# Patient Record
Sex: Male | Born: 1945
Health system: Southern US, Community
[De-identification: ages and names within clinical notes are randomized; demographics above are authoritative.]

## PROBLEM LIST (undated history)

## (undated) DIAGNOSIS — E785 Hyperlipidemia, unspecified: Secondary | ICD-10-CM

## (undated) DIAGNOSIS — R7611 Nonspecific reaction to tuberculin skin test without active tuberculosis: Secondary | ICD-10-CM

## (undated) DIAGNOSIS — I639 Cerebral infarction, unspecified: Secondary | ICD-10-CM

## (undated) DIAGNOSIS — I2510 Atherosclerotic heart disease of native coronary artery without angina pectoris: Secondary | ICD-10-CM

## (undated) DIAGNOSIS — I219 Acute myocardial infarction, unspecified: Secondary | ICD-10-CM

## (undated) HISTORY — DX: Acute myocardial infarction, unspecified: I21.9

## (undated) HISTORY — DX: Nonspecific reaction to tuberculin skin test without active tuberculosis: R76.11

## (undated) HISTORY — DX: Atherosclerotic heart disease of native coronary artery without angina pectoris: I25.10

## (undated) HISTORY — DX: Cerebral infarction, unspecified: I63.9

## (undated) HISTORY — DX: Hyperlipidemia, unspecified: E78.5

## (undated) HISTORY — PX: APPENDECTOMY: SHX54

---

## 1983-02-22 HISTORY — PX: INGUINAL HERNIA REPAIR: SHX194

## 1989-02-21 DIAGNOSIS — I219 Acute myocardial infarction, unspecified: Secondary | ICD-10-CM

## 1989-02-21 HISTORY — DX: Acute myocardial infarction, unspecified: I21.9

## 1989-02-21 HISTORY — PX: CORONARY ARTERY BYPASS GRAFT: SHX141

## 1999-03-18 ENCOUNTER — Encounter: Payer: Self-pay | Admitting: Internal Medicine

## 1999-03-18 ENCOUNTER — Encounter: Admission: RE | Admit: 1999-03-18 | Discharge: 1999-03-18 | Payer: Self-pay | Admitting: Internal Medicine

## 1999-04-12 ENCOUNTER — Encounter (INDEPENDENT_AMBULATORY_CARE_PROVIDER_SITE_OTHER): Payer: Self-pay | Admitting: Specialist

## 1999-04-12 ENCOUNTER — Ambulatory Visit (HOSPITAL_BASED_OUTPATIENT_CLINIC_OR_DEPARTMENT_OTHER): Admission: RE | Admit: 1999-04-12 | Discharge: 1999-04-12 | Payer: Self-pay | Admitting: Urology

## 1999-04-20 ENCOUNTER — Encounter: Admission: RE | Admit: 1999-04-20 | Discharge: 1999-04-20 | Payer: Self-pay | Admitting: Urology

## 1999-04-20 ENCOUNTER — Encounter: Payer: Self-pay | Admitting: Urology

## 1999-05-23 DIAGNOSIS — I639 Cerebral infarction, unspecified: Secondary | ICD-10-CM

## 1999-05-23 HISTORY — DX: Cerebral infarction, unspecified: I63.9

## 1999-06-01 ENCOUNTER — Ambulatory Visit (HOSPITAL_COMMUNITY): Admission: RE | Admit: 1999-06-01 | Discharge: 1999-06-01 | Payer: Self-pay | Admitting: Internal Medicine

## 1999-06-01 ENCOUNTER — Encounter: Payer: Self-pay | Admitting: Internal Medicine

## 1999-06-11 ENCOUNTER — Ambulatory Visit: Admission: RE | Admit: 1999-06-11 | Discharge: 1999-06-11 | Payer: Self-pay | Admitting: Internal Medicine

## 1999-06-23 ENCOUNTER — Encounter (INDEPENDENT_AMBULATORY_CARE_PROVIDER_SITE_OTHER): Payer: Self-pay | Admitting: *Deleted

## 1999-06-23 ENCOUNTER — Ambulatory Visit (HOSPITAL_BASED_OUTPATIENT_CLINIC_OR_DEPARTMENT_OTHER): Admission: RE | Admit: 1999-06-23 | Discharge: 1999-06-23 | Payer: Self-pay | Admitting: Urology

## 1999-08-22 ENCOUNTER — Ambulatory Visit (HOSPITAL_COMMUNITY): Admission: RE | Admit: 1999-08-22 | Discharge: 1999-08-22 | Payer: Self-pay | Admitting: Internal Medicine

## 1999-08-22 ENCOUNTER — Encounter: Payer: Self-pay | Admitting: Internal Medicine

## 1999-10-18 ENCOUNTER — Encounter: Payer: Self-pay | Admitting: Urology

## 1999-10-18 ENCOUNTER — Encounter: Admission: RE | Admit: 1999-10-18 | Discharge: 1999-10-18 | Payer: Self-pay | Admitting: Urology

## 2001-03-08 ENCOUNTER — Encounter: Admission: RE | Admit: 2001-03-08 | Discharge: 2001-03-27 | Payer: Self-pay | Admitting: Internal Medicine

## 2003-03-07 ENCOUNTER — Encounter: Admission: RE | Admit: 2003-03-07 | Discharge: 2003-03-07 | Payer: Self-pay | Admitting: Urology

## 2003-03-12 ENCOUNTER — Ambulatory Visit (HOSPITAL_COMMUNITY): Admission: RE | Admit: 2003-03-12 | Discharge: 2003-03-12 | Payer: Self-pay | Admitting: Urology

## 2003-03-12 ENCOUNTER — Ambulatory Visit (HOSPITAL_BASED_OUTPATIENT_CLINIC_OR_DEPARTMENT_OTHER): Admission: RE | Admit: 2003-03-12 | Discharge: 2003-03-12 | Payer: Self-pay | Admitting: Urology

## 2003-03-12 ENCOUNTER — Encounter (INDEPENDENT_AMBULATORY_CARE_PROVIDER_SITE_OTHER): Payer: Self-pay | Admitting: Specialist

## 2004-01-09 ENCOUNTER — Ambulatory Visit: Payer: Self-pay | Admitting: Internal Medicine

## 2004-01-27 ENCOUNTER — Ambulatory Visit: Payer: Self-pay | Admitting: Internal Medicine

## 2004-01-29 ENCOUNTER — Ambulatory Visit: Payer: Self-pay | Admitting: Internal Medicine

## 2004-03-01 ENCOUNTER — Ambulatory Visit: Payer: Self-pay | Admitting: Internal Medicine

## 2004-03-09 ENCOUNTER — Ambulatory Visit: Payer: Self-pay | Admitting: Cardiology

## 2004-05-31 ENCOUNTER — Ambulatory Visit: Payer: Self-pay | Admitting: Internal Medicine

## 2004-06-16 ENCOUNTER — Ambulatory Visit: Payer: Self-pay | Admitting: Internal Medicine

## 2004-10-08 ENCOUNTER — Ambulatory Visit: Payer: Self-pay | Admitting: Internal Medicine

## 2004-10-14 ENCOUNTER — Ambulatory Visit: Payer: Self-pay | Admitting: Internal Medicine

## 2004-11-04 ENCOUNTER — Ambulatory Visit: Payer: Self-pay | Admitting: Cardiology

## 2005-02-25 ENCOUNTER — Ambulatory Visit: Payer: Self-pay | Admitting: Internal Medicine

## 2005-02-28 ENCOUNTER — Ambulatory Visit: Payer: Self-pay | Admitting: Cardiology

## 2005-03-07 ENCOUNTER — Ambulatory Visit: Payer: Self-pay

## 2005-03-08 ENCOUNTER — Ambulatory Visit: Payer: Self-pay | Admitting: Internal Medicine

## 2005-04-01 ENCOUNTER — Ambulatory Visit: Payer: Self-pay | Admitting: Internal Medicine

## 2005-06-27 ENCOUNTER — Encounter: Admission: RE | Admit: 2005-06-27 | Discharge: 2005-06-27 | Payer: Self-pay | Admitting: Internal Medicine

## 2005-06-27 ENCOUNTER — Ambulatory Visit: Payer: Self-pay | Admitting: Internal Medicine

## 2005-07-05 ENCOUNTER — Ambulatory Visit: Payer: Self-pay | Admitting: Internal Medicine

## 2005-08-22 ENCOUNTER — Ambulatory Visit: Payer: Self-pay | Admitting: Internal Medicine

## 2005-09-06 ENCOUNTER — Ambulatory Visit: Payer: Self-pay | Admitting: Internal Medicine

## 2005-12-01 ENCOUNTER — Ambulatory Visit: Payer: Self-pay | Admitting: Internal Medicine

## 2005-12-01 LAB — CONVERTED CEMR LAB
ALT: 23 units/L (ref 0–40)
AST: 24 units/L (ref 0–37)
Albumin: 4.5 g/dL (ref 3.5–5.2)
Alkaline Phosphatase: 59 units/L (ref 39–117)
BUN: 11 mg/dL (ref 6–23)
Basophils Absolute: 0 10*3/uL (ref 0.0–0.1)
Basophils Relative: 0.4 % (ref 0.0–1.0)
CO2: 29 meq/L (ref 19–32)
Calcium: 9.9 mg/dL (ref 8.4–10.5)
Chloride: 106 meq/L (ref 96–112)
Chol/HDL Ratio, serum: 3.6
Cholesterol: 132 mg/dL (ref 0–200)
Creatinine, Ser: 0.9 mg/dL (ref 0.4–1.5)
Eosinophil percent: 0.4 % (ref 0.0–5.0)
GFR calc non Af Amer: 91 mL/min
Glomerular Filtration Rate, Af Am: 110 mL/min/{1.73_m2}
Glucose, Bld: 131 mg/dL — ABNORMAL HIGH (ref 70–99)
HCT: 46.9 % (ref 39.0–52.0)
HDL: 36.7 mg/dL — ABNORMAL LOW (ref >39.0)
Hemoglobin: 15.9 g/dL (ref 13.0–17.0)
Hgb A1c MFr Bld: 8 % — ABNORMAL HIGH (ref 4.6–6.0)
LDL Cholesterol: 71 mg/dL (ref 0–99)
Lymphocytes Relative: 32.1 % (ref 12.0–46.0)
MCHC: 33.8 g/dL (ref 30.0–36.0)
MCV: 91.2 fL (ref 78.0–100.0)
Monocytes Absolute: 0.6 10*3/uL (ref 0.2–0.7)
Monocytes Relative: 7.8 % (ref 3.0–11.0)
Neutro Abs: 4.9 10*3/uL (ref 1.4–7.7)
Neutrophils Relative %: 59.3 % (ref 43.0–77.0)
PSA: 0.44 ng/mL (ref 0.10–4.00)
Platelets: 218 10*3/uL (ref 150–400)
Potassium: 3.9 meq/L (ref 3.5–5.1)
RBC: 5.14 M/uL (ref 4.22–5.81)
RDW: 12 % (ref 11.5–14.6)
Sodium: 142 meq/L (ref 135–145)
TSH: 0.9 microintl units/mL (ref 0.35–5.50)
Total Bilirubin: 0.6 mg/dL (ref 0.3–1.2)
Total Protein: 7.6 g/dL (ref 6.0–8.3)
Triglyceride fasting, serum: 124 mg/dL (ref 0–149)
VLDL: 25 mg/dL (ref 0–40)
WBC: 8.1 10*3/uL (ref 4.5–10.5)

## 2005-12-27 ENCOUNTER — Ambulatory Visit: Payer: Self-pay | Admitting: Internal Medicine

## 2006-03-02 ENCOUNTER — Ambulatory Visit: Payer: Self-pay | Admitting: Cardiology

## 2006-04-27 ENCOUNTER — Ambulatory Visit: Payer: Self-pay | Admitting: Internal Medicine

## 2006-04-27 LAB — CONVERTED CEMR LAB
ALT: 25 units/L (ref 0–40)
AST: 26 units/L (ref 0–37)
Albumin: 4.5 g/dL (ref 3.5–5.2)
Alkaline Phosphatase: 59 units/L (ref 39–117)
BUN: 14 mg/dL (ref 6–23)
Bilirubin, Direct: 0.1 mg/dL (ref 0.0–0.3)
CO2: 29 meq/L (ref 19–32)
Calcium: 10.8 mg/dL — ABNORMAL HIGH (ref 8.4–10.5)
Chloride: 109 meq/L (ref 96–112)
Cholesterol: 149 mg/dL (ref 0–200)
Creatinine, Ser: 1 mg/dL (ref 0.4–1.5)
GFR calc Af Amer: 98 mL/min
GFR calc non Af Amer: 81 mL/min
Glucose, Bld: 133 mg/dL — ABNORMAL HIGH (ref 70–99)
HDL: 45 mg/dL (ref >39.0)
Hgb A1c MFr Bld: 7.6 % — ABNORMAL HIGH (ref 4.6–6.0)
LDL Cholesterol: 70 mg/dL (ref 0–99)
Potassium: 4.7 meq/L (ref 3.5–5.1)
Sodium: 151 meq/L — ABNORMAL HIGH (ref 135–145)
Total Bilirubin: 0.9 mg/dL (ref 0.3–1.2)
Total CHOL/HDL Ratio: 3.3
Total Protein: 7.9 g/dL (ref 6.0–8.3)
Triglycerides: 168 mg/dL — ABNORMAL HIGH (ref 0–149)
VLDL: 34 mg/dL (ref 0–40)

## 2006-05-04 ENCOUNTER — Ambulatory Visit: Payer: Self-pay | Admitting: Internal Medicine

## 2006-08-21 DIAGNOSIS — E119 Type 2 diabetes mellitus without complications: Secondary | ICD-10-CM | POA: Insufficient documentation

## 2006-08-21 DIAGNOSIS — I25119 Atherosclerotic heart disease of native coronary artery with unspecified angina pectoris: Secondary | ICD-10-CM

## 2006-08-21 DIAGNOSIS — I252 Old myocardial infarction: Secondary | ICD-10-CM

## 2006-08-21 DIAGNOSIS — Z8673 Personal history of transient ischemic attack (TIA), and cerebral infarction without residual deficits: Secondary | ICD-10-CM | POA: Insufficient documentation

## 2006-08-21 DIAGNOSIS — Z8551 Personal history of malignant neoplasm of bladder: Secondary | ICD-10-CM

## 2006-08-21 DIAGNOSIS — E785 Hyperlipidemia, unspecified: Secondary | ICD-10-CM

## 2006-09-05 ENCOUNTER — Ambulatory Visit: Payer: Self-pay | Admitting: Internal Medicine

## 2006-09-05 LAB — CONVERTED CEMR LAB
ALT: 26 units/L (ref 0–53)
AST: 25 units/L (ref 0–37)
Albumin: 4 g/dL (ref 3.5–5.2)
Alkaline Phosphatase: 50 units/L (ref 39–117)
Bilirubin, Direct: 0.1 mg/dL (ref 0.0–0.3)
Cholesterol: 119 mg/dL (ref 0–200)
HDL: 37.9 mg/dL — ABNORMAL LOW (ref >39.0)
Hgb A1c MFr Bld: 7.1 % — ABNORMAL HIGH (ref 4.6–6.0)
LDL Cholesterol: 54 mg/dL (ref 0–99)
Total Bilirubin: 1 mg/dL (ref 0.3–1.2)
Total CHOL/HDL Ratio: 3.1
Total Protein: 7.1 g/dL (ref 6.0–8.3)
Triglycerides: 135 mg/dL (ref 0–149)
VLDL: 27 mg/dL (ref 0–40)

## 2006-09-19 ENCOUNTER — Ambulatory Visit: Payer: Self-pay | Admitting: Internal Medicine

## 2006-09-19 LAB — CONVERTED CEMR LAB
Cholesterol, target level: 200 mg/dL
HDL goal, serum: 40 mg/dL
LDL Goal: 70 mg/dL

## 2006-09-26 ENCOUNTER — Telehealth (INDEPENDENT_AMBULATORY_CARE_PROVIDER_SITE_OTHER): Payer: Self-pay | Admitting: *Deleted

## 2007-01-15 ENCOUNTER — Ambulatory Visit: Payer: Self-pay | Admitting: Internal Medicine

## 2007-01-15 LAB — CONVERTED CEMR LAB
ALT: 23 units/L (ref 0–53)
AST: 22 units/L (ref 0–37)
Albumin: 4.6 g/dL (ref 3.5–5.2)
Alkaline Phosphatase: 53 units/L (ref 39–117)
BUN: 14 mg/dL (ref 6–23)
Bilirubin, Direct: 0.1 mg/dL (ref 0.0–0.3)
CO2: 27 meq/L (ref 19–32)
Calcium: 10.4 mg/dL (ref 8.4–10.5)
Chloride: 105 meq/L (ref 96–112)
Cholesterol: 141 mg/dL (ref 0–200)
Creatinine, Ser: 1 mg/dL (ref 0.4–1.5)
GFR calc Af Amer: 97 mL/min
GFR calc non Af Amer: 80 mL/min
Glucose, Bld: 140 mg/dL — ABNORMAL HIGH (ref 70–99)
HDL: 36.5 mg/dL — ABNORMAL LOW (ref >39.0)
Hgb A1c MFr Bld: 7 % — ABNORMAL HIGH (ref 4.6–6.0)
LDL Cholesterol: 78 mg/dL (ref 0–99)
Potassium: 5.4 meq/L — ABNORMAL HIGH (ref 3.5–5.1)
Sodium: 143 meq/L (ref 135–145)
Total Bilirubin: 0.7 mg/dL (ref 0.3–1.2)
Total CHOL/HDL Ratio: 3.9
Total Protein: 7.6 g/dL (ref 6.0–8.3)
Triglycerides: 132 mg/dL (ref 0–149)
VLDL: 26 mg/dL (ref 0–40)

## 2007-01-22 ENCOUNTER — Ambulatory Visit: Payer: Self-pay | Admitting: Internal Medicine

## 2007-01-22 DIAGNOSIS — I10 Essential (primary) hypertension: Secondary | ICD-10-CM

## 2007-02-07 ENCOUNTER — Ambulatory Visit: Payer: Self-pay | Admitting: Internal Medicine

## 2007-02-07 LAB — CONVERTED CEMR LAB
BUN: 15 mg/dL (ref 6–23)
CO2: 29 meq/L (ref 19–32)
Calcium: 10 mg/dL (ref 8.4–10.5)
Chloride: 105 meq/L (ref 96–112)
Creatinine, Ser: 0.8 mg/dL (ref 0.4–1.5)
GFR calc Af Amer: 126 mL/min
GFR calc non Af Amer: 104 mL/min
Glucose, Bld: 124 mg/dL — ABNORMAL HIGH (ref 70–99)
Potassium: 4.7 meq/L (ref 3.5–5.1)
Sodium: 141 meq/L (ref 135–145)

## 2007-04-17 ENCOUNTER — Telehealth: Payer: Self-pay | Admitting: Internal Medicine

## 2007-05-16 ENCOUNTER — Ambulatory Visit: Payer: Self-pay | Admitting: Internal Medicine

## 2007-05-16 LAB — CONVERTED CEMR LAB
ALT: 22 units/L (ref 0–53)
AST: 26 units/L (ref 0–37)
Albumin: 4.2 g/dL (ref 3.5–5.2)
Bilirubin, Direct: 0.2 mg/dL (ref 0.0–0.3)
Creatinine, Ser: 0.9 mg/dL (ref 0.4–1.5)
Total Bilirubin: 0.6 mg/dL (ref 0.3–1.2)
Total CHOL/HDL Ratio: 3.6
Total Protein: 6.9 g/dL (ref 6.0–8.3)
VLDL: 20 mg/dL (ref 0–40)

## 2007-06-12 ENCOUNTER — Ambulatory Visit: Payer: Self-pay | Admitting: Internal Medicine

## 2007-10-23 ENCOUNTER — Ambulatory Visit: Payer: Self-pay | Admitting: Cardiology

## 2007-10-24 ENCOUNTER — Ambulatory Visit: Payer: Self-pay | Admitting: Internal Medicine

## 2007-10-24 LAB — CONVERTED CEMR LAB
ALT: 22 units/L (ref 0–53)
Albumin: 4 g/dL (ref 3.5–5.2)
Alkaline Phosphatase: 46 units/L (ref 39–117)
BUN: 12 mg/dL (ref 6–23)
Bilirubin, Direct: 0.1 mg/dL (ref 0.0–0.3)
Calcium: 9.6 mg/dL (ref 8.4–10.5)
Cholesterol: 116 mg/dL (ref 0–200)
Creatinine, Ser: 0.8 mg/dL (ref 0.4–1.5)
GFR calc Af Amer: 126 mL/min
HDL: 35.6 mg/dL — ABNORMAL LOW (ref >39.0)
LDL Cholesterol: 44 mg/dL (ref 0–99)
Potassium: 4.2 meq/L (ref 3.5–5.1)
Total Bilirubin: 0.6 mg/dL (ref 0.3–1.2)
Total CHOL/HDL Ratio: 3.3
Triglycerides: 184 mg/dL — ABNORMAL HIGH (ref 0–149)
VLDL: 37 mg/dL (ref 0–40)

## 2007-11-05 ENCOUNTER — Ambulatory Visit: Payer: Self-pay | Admitting: Internal Medicine

## 2007-11-05 LAB — HM DIABETES FOOT EXAM

## 2007-12-11 ENCOUNTER — Ambulatory Visit: Payer: Self-pay

## 2007-12-11 ENCOUNTER — Ambulatory Visit: Payer: Self-pay | Admitting: Cardiology

## 2008-02-27 ENCOUNTER — Ambulatory Visit: Payer: Self-pay | Admitting: Internal Medicine

## 2008-03-17 ENCOUNTER — Ambulatory Visit: Payer: Self-pay | Admitting: Internal Medicine

## 2008-03-17 DIAGNOSIS — F17200 Nicotine dependence, unspecified, uncomplicated: Secondary | ICD-10-CM

## 2008-04-15 ENCOUNTER — Telehealth: Payer: Self-pay | Admitting: Gastroenterology

## 2008-05-13 ENCOUNTER — Ambulatory Visit: Payer: Self-pay | Admitting: Gastroenterology

## 2008-05-16 ENCOUNTER — Telehealth: Payer: Self-pay | Admitting: Gastroenterology

## 2008-05-19 ENCOUNTER — Ambulatory Visit: Payer: Self-pay | Admitting: Gastroenterology

## 2008-05-21 ENCOUNTER — Encounter: Payer: Self-pay | Admitting: Gastroenterology

## 2008-07-01 ENCOUNTER — Ambulatory Visit: Payer: Self-pay | Admitting: Internal Medicine

## 2008-07-01 LAB — CONVERTED CEMR LAB
AST: 27 units/L (ref 0–37)
Hgb A1c MFr Bld: 7.6 % — ABNORMAL HIGH (ref 4.6–6.5)
Potassium: 5.3 meq/L — ABNORMAL HIGH (ref 3.5–5.1)
Total Bilirubin: 0.8 mg/dL (ref 0.3–1.2)
Total Protein: 7.4 g/dL (ref 6.0–8.3)
VLDL: 25.6 mg/dL (ref 0.0–40.0)

## 2008-07-08 ENCOUNTER — Ambulatory Visit: Payer: Self-pay | Admitting: Internal Medicine

## 2008-11-03 ENCOUNTER — Ambulatory Visit: Payer: Self-pay | Admitting: Internal Medicine

## 2008-11-03 LAB — CONVERTED CEMR LAB
ALT: 19 units/L (ref 0–53)
Alkaline Phosphatase: 51 units/L (ref 39–117)
Calcium: 9.8 mg/dL (ref 8.4–10.5)
Chloride: 108 meq/L (ref 96–112)
GFR calc non Af Amer: 79.88 mL/min (ref >60)
Hgb A1c MFr Bld: 7.1 % — ABNORMAL HIGH (ref 4.6–6.5)
LDL Cholesterol: 58 mg/dL (ref 0–99)
Sodium: 145 meq/L (ref 135–145)
Total Bilirubin: 0.8 mg/dL (ref 0.3–1.2)
Total CHOL/HDL Ratio: 3
Triglycerides: 118 mg/dL (ref 0.0–149.0)

## 2008-11-17 ENCOUNTER — Ambulatory Visit: Payer: Self-pay | Admitting: Internal Medicine

## 2009-01-27 ENCOUNTER — Ambulatory Visit: Payer: Self-pay | Admitting: Cardiology

## 2009-02-26 ENCOUNTER — Encounter: Payer: Self-pay | Admitting: Internal Medicine

## 2009-03-09 ENCOUNTER — Encounter: Payer: Self-pay | Admitting: Internal Medicine

## 2009-03-11 ENCOUNTER — Encounter: Payer: Self-pay | Admitting: Internal Medicine

## 2009-04-06 ENCOUNTER — Encounter: Payer: Self-pay | Admitting: Internal Medicine

## 2009-04-06 ENCOUNTER — Telehealth: Payer: Self-pay | Admitting: Internal Medicine

## 2009-04-08 ENCOUNTER — Ambulatory Visit: Payer: Self-pay | Admitting: Internal Medicine

## 2009-05-18 ENCOUNTER — Ambulatory Visit: Payer: Self-pay | Admitting: Internal Medicine

## 2009-05-18 LAB — CONVERTED CEMR LAB
AST: 22 units/L (ref 0–37)
Albumin: 4 g/dL (ref 3.5–5.2)
Calcium: 9.3 mg/dL (ref 8.4–10.5)
Cholesterol: 111 mg/dL (ref 0–200)
Creatinine, Ser: 1 mg/dL (ref 0.4–1.5)
GFR calc non Af Amer: 79.75 mL/min (ref >60)
Hgb A1c MFr Bld: 7.5 % — ABNORMAL HIGH (ref 4.6–6.5)
LDL Cholesterol: 39 mg/dL (ref 0–99)
Sodium: 140 meq/L (ref 135–145)
Total CHOL/HDL Ratio: 3
Total Protein: 7.5 g/dL (ref 6.0–8.3)
Triglycerides: 172 mg/dL — ABNORMAL HIGH (ref 0.0–149.0)
VLDL: 34.4 mg/dL (ref 0.0–40.0)

## 2009-05-25 ENCOUNTER — Ambulatory Visit: Payer: Self-pay | Admitting: Internal Medicine

## 2009-05-27 ENCOUNTER — Encounter: Payer: Self-pay | Admitting: Internal Medicine

## 2009-06-09 ENCOUNTER — Encounter: Payer: Self-pay | Admitting: Internal Medicine

## 2009-09-15 ENCOUNTER — Telehealth: Payer: Self-pay | Admitting: Internal Medicine

## 2009-12-14 ENCOUNTER — Ambulatory Visit: Payer: Self-pay | Admitting: Internal Medicine

## 2009-12-14 LAB — CONVERTED CEMR LAB
Urobilinogen, UA: 0.2
pH: 5

## 2009-12-17 LAB — CONVERTED CEMR LAB
ALT: 20 units/L (ref 0–53)
AST: 22 units/L (ref 0–37)
Albumin: 4.4 g/dL (ref 3.5–5.2)
BUN: 14 mg/dL (ref 6–23)
Basophils Absolute: 0 10*3/uL (ref 0.0–0.1)
Basophils Relative: 0.5 % (ref 0.0–3.0)
CO2: 21 meq/L (ref 19–32)
Calcium: 9.6 mg/dL (ref 8.4–10.5)
Chloride: 104 meq/L (ref 96–112)
Glucose, Bld: 138 mg/dL — ABNORMAL HIGH (ref 70–99)
HDL: 37.5 mg/dL — ABNORMAL LOW (ref >39.00)
Hemoglobin: 14.8 g/dL (ref 13.0–17.0)
Hgb A1c MFr Bld: 7.8 % — ABNORMAL HIGH (ref 4.6–6.5)
Lymphs Abs: 3.3 10*3/uL (ref 0.7–4.0)
MCV: 90.4 fL (ref 78.0–100.0)
Monocytes Relative: 7.5 % (ref 3.0–12.0)
RBC: 4.8 M/uL (ref 4.22–5.81)
TSH: 0.84 microintl units/mL (ref 0.35–5.50)
Total Bilirubin: 0.6 mg/dL (ref 0.3–1.2)
Total CHOL/HDL Ratio: 3
Triglycerides: 158 mg/dL — ABNORMAL HIGH (ref 0.0–149.0)
VLDL: 31.6 mg/dL (ref 0.0–40.0)

## 2010-01-08 ENCOUNTER — Ambulatory Visit: Payer: Self-pay | Admitting: Cardiology

## 2010-03-23 NOTE — Letter (Signed)
Summary: Medical Report for [facility]  Medical Report for [facility]   Imported By: Maryln Gottron 04/14/2009 12:39:02  _____________________________________________________________________  External Attachment:    Type:   Image     Comment:   External Document

## 2010-03-23 NOTE — Assessment & Plan Note (Signed)
Summary: rov      Allergies Added: NKDA  Visit Type:  Follow-up Primary Provider:  Birdie Sons MD  CC:  CAD.  History of Present Illness: Patient presents for followup of his known coronary disease. It has been one year since his last appointment. Since I last saw him he has done well. He has had some atypical discomfort under his left axilla. It is  tenderness to touch. It is unlike previous angina.  It is sporadic lasting for a few minutes at a time. It is not particularly severe and not associated with other symptoms area with his activities which include golfing he does not bring on any chest discomfort, neck or arm discomfort. He has had no palpitations, presyncope or syncope. He denies any PND or orthopnea.  Current Medications (verified): 1)  Aspirin Ec 325 Mg  Tbec (Aspirin) .... Bid 2)  Enalapril Maleate 20 Mg  Tabs (Enalapril Maleate) .Marland Kitchen.. 1 By Mouth Twice Daily 3)  Metformin Hcl 1000 Mg Tabs (Metformin Hcl) .... Take 1 Tablet By Mouth Twice A Day 4)  Metoprolol Tartrate 50 Mg Tabs (Metoprolol Tartrate) .... Take 1 Tablet By Mouth Twice A Day 5)  Simvastatin 80 Mg Tabs (Simvastatin) .... Take 1 Tablet By Mouth At Bedtime  Allergies (verified): No Known Drug Allergies  Past History:  Past Medical History: Reviewed history from 01/27/2009 and no changes required. Coronary artery disease  (CABG in 1991. Myocardial infarction with Taxus stenting to a native vessel in 2005) Diabetes mellitus, type II 2005 Hyperlipidemia Myocardial infarction, hx of 1991 Cerebrovascular accident, hx of 04/01 TCCA of bladder Positive PPD  Past Surgical History: Reviewed history from 01/27/2009 and no changes required. Appendectomy, as child Coronary artery bypass graft 1991 Inguinal herniorrhaphy 1985  Review of Systems       As stated in the HPI and negative for all other systems.   Vital Signs:  Patient profile:   65 year old male Height:      65.75 inches Weight:      175  pounds BMI:     28.56 Pulse rate:   63 / minute Resp:     16 per minute BP sitting:   122 / 74  (right arm)  Vitals Entered By: Marrion Coy, CNA (January 08, 2010 8:26 AM)  Physical Exam  General:  Well developed, well nourished, in no acute distress. Head:  normocephalic and atraumatic Mouth:  Poor dentition. Oral mucosa normal. Neck:  Neck supple, no JVD. No masses, thyromegaly or abnormal cervical nodes. Chest Wall:  Well-healed sternotomy scar Lungs:  Clear bilaterally to auscultation and percussion. Abdomen:  Bowel sounds positive; abdomen soft and non-tender without masses, organomegaly, or hernias noted. No hepatosplenomegaly. Msk:  Back normal, normal gait. Muscle strength and tone normal. Extremities:  No clubbing or cyanosis. Neurologic:  Alert and oriented x 3. Skin:  Intact without lesions or rashes. Cervical Nodes:  no significant adenopathy Inguinal Nodes:  no significant adenopathy Psych:  Normal affect.   Detailed Cardiovascular Exam  Neck    Carotids: Carotids full and equal bilaterally without bruits.      Neck Veins: Normal, no JVD.    Heart    Inspection: no deformities or lifts noted.      Palpation: normal PMI with no thrills palpable.      Auscultation: regular rate and rhythm, S1, S2 without murmurs, rubs, gallops, or clicks.    Vascular    Abdominal Aorta: no palpable masses, pulsations, or audible bruits.  Femoral Pulses: normal femoral pulses bilaterally.      Pedal Pulses: normal pedal pulses bilaterally.      Radial Pulses: normal radial pulses bilaterally.      Peripheral Circulation: no clubbing, cyanosis, or edema noted with normal capillary refill.     EKG  Procedure date:  01/08/2010  Findings:      Sinus rhythm, rate 63, axis within normal limits, intervals within normal limits, no acute ST-T wave changes  Impression & Recommendations:  Problem # 1:  CORONARY ARTERY DISEASE (ICD-414.00) He  has no new anginal symptoms  since his stress test in 2009. His current pain is very atypical. However, it he does need to be more aggressive with risk reduction. I do not think further stress imaging or catheterization is indicated at this time.  Problem # 2:  TOBACCO USE (ICD-305.1) We discussed the need to stop smoking.  Problem # 3:  ESSENTIAL HYPERTENSION, BENIGN (ICD-401.1) His blood pressure is controlled. He will continue the meds as listed.  Problem # 4:  DIABETES MELLITUS, TYPE II (ICD-250.00) Is hemoglobin A1c was not at target but [name]  plans to see him February to discuss this.  Problem # 5:  HYPERLIPIDEMIA (ICD-272.4) His LDL was at target. His HDL slightly low and could go up with exercise and we discussed. He has been on 80 mg of simvastatin for greater than a year and can remain on this.  Other Orders: EKG w/ Interpretation (93000)  Patient Instructions: 1)  Your physician recommends that you schedule a follow-up appointment in: 1 yr with Dr Antoine Poche 2)  Your physician recommends that you continue on your current medications as directed. Please refer to the Current Medication list given to you today. Prescriptions: METOPROLOL TARTRATE 50 MG TABS (METOPROLOL TARTRATE) Take 1 tablet by mouth twice a day  #180 x 3   Entered by:   Charolotte Capuchin, RN   Authorized by:   Rollene Rotunda, MD, [facility]   Signed by:   Charolotte Capuchin, RN on 01/08/2010   Method used:   Electronically to        [facility]* (mail-order)       [address] [place], [place]  [address]       Ph: [phone]       Fax: [phone]   RxID:   [id number]

## 2010-03-23 NOTE — Progress Notes (Signed)
Summary: DOT PAPERWORK  Phone Note Call from Patient   Caller: Patient [phone] Reason for Call: Talk to Nurse, Talk to Doctor Summary of Call: Pt stated that he dropped off papers from the DOT that needed to be filled out and faxed to DOT but they are adv that same was never received.... Pt adv he dropped paperwork off on Monday, Jan. 10, 2011 but never heard anything and DOT is stating they never received the paperwork.... Can you advise?  Pt can be reached at [phone] with any questions or concerns.  Initial call taken by: Debbra Riding,  April 06, 2009 8:47 AM  Follow-up for Phone Call        Have relocated form and will bring to MD attention for completion.  Left message on pt phone to this effect. Follow-up by: Gladis Riffle, RN,  April 06, 2009 3:55 PM  Additional Follow-up for Phone Call Additional follow up Details #1::        form completed.Patient notified.  Additional Follow-up by: Gladis Riffle, RN,  April 07, 2009 8:12 AM

## 2010-03-23 NOTE — Letter (Signed)
Summary: [facility]  [facility]   Imported By: Maryln Gottron 06/22/2009 10:13:46  _____________________________________________________________________  External Attachment:    Type:   Image     Comment:   External Document

## 2010-03-23 NOTE — Letter (Signed)
Summary: Medical Report for [facility]  Medical Report for [facility]   Imported By: Maryln Gottron 04/08/2009 13:03:06  _____________________________________________________________________  External Attachment:    Type:   Image     Comment:   External Document

## 2010-03-23 NOTE — Miscellaneous (Signed)
Summary: diabetic eye exam   Clinical Lists Changes  Observations: Added new observation of EYES COMMENT: 03/2010 (03/11/2009 16:28) Added new observation of EYE EXAM BY: [facility] (03/09/2009 16:29) Added new observation of DMEYEEXMRES: normal (03/09/2009 16:29) Added new observation of DIAB EYE EX: normal (03/09/2009 16:29)      Diabetes Management History:      He has not been enrolled in the "Diabetic Education Program".  He is not checking home blood sugars.  He says that he is not exercising regularly.    Diabetes Management Exam:    Eye Exam:       Eye Exam done elsewhere          Date: 03/09/2009          Results: normal          Done by: [facility]  Diabetes Management Assessment/Plan:      The following lipid goals have been established for the patient: Total cholesterol goal of 200; LDL cholesterol goal of 70; HDL cholesterol goal of 40; Triglyceride goal of 150.

## 2010-03-23 NOTE — Letter (Signed)
Summary: [facility]  [facility]   Imported By: Maryln Gottron 06/10/2009 12:49:10  _____________________________________________________________________  External Attachment:    Type:   Image     Comment:   External Document

## 2010-03-23 NOTE — Letter (Signed)
Summary: Diabetic Eye Evaluation/[facility]  Diabetic Eye Evaluation/[facility]   Imported By: Maryln Gottron 03/27/2009 14:15:14  _____________________________________________________________________  External Attachment:    Type:   Image     Comment:   External Document

## 2010-03-23 NOTE — Progress Notes (Signed)
Summary: new rx  Phone Note Call from Patient Call back at Home Phone [phone]   Caller: Patient Call For: Birdie Sons MD Summary of Call: pt needs metformin 1000mg  twice a day #180 with 3 refills please call  [facility] [phone] Initial call taken by: Heron Sabins,  September 15, 2009 9:11 AM    Prescriptions: METFORMIN HCL 1000 MG TABS (METFORMIN HCL) Take 1 tablet by mouth twice a day  #180 x 3   Entered by:   Kern Reap CMA (AAMA)   Authorized by:   Birdie Sons MD   Signed by:   Kern Reap CMA (AAMA) on 09/15/2009   Method used:   Electronically to        [facility]* (mail-order)       [address] [place], [place]  [address]       Ph: [phone]       Fax: [phone]   RxID:   [id number]

## 2010-03-23 NOTE — Assessment & Plan Note (Signed)
Summary: 6 mo rov/mm   Vital Signs:  Patient profile:   65 year old [place] Weight:      177 pounds Temp:     97.9 degrees F Pulse rate:   68 / minute Pulse rhythm:   regular Resp:     12 per minute BP sitting:   140 / 78  (left arm)  Vitals Entered By: Gladis Riffle, RN (May 25, 2009 8:04 AM) CC: 6 month rov, labs done--does not check CBGs at home Is Patient Diabetic? Yes Did you bring your meter with you today? No Comments needs refill simvastatin to [facility]   Primary Care Provider:  Birdie Sons MD  CC:  6 month rov and labs done--does not check CBGs at home.  History of Present Illness:  Follow-Up Visit      This is a 65 year old man who presents for Follow-up visit.  The patient denies chest pain and palpitations.  Since the last visit the patient notes no new problems or concerns.  The patient reports taking meds as prescribed.  When questioned about possible medication side effects, the patient notes none.    All other systems reviewed and were negative   Preventive Screening-Counseling & Management  Alcohol-Tobacco     Smoking Status: current     Cigars/week: 14  Current Problems (verified): 1)  Knee Pain, Left, Chronic  (ICD-719.46) 2)  Tobacco Use  (ICD-305.1) 3)  Essential Hypertension, Benign  (ICD-401.1) 4)  Bladder Cancer  (ICD-188.9) 5)  Cerebrovascular Accident, Hx of  (ICD-V12.50) 6)  Myocardial Infarction, Hx of  (ICD-412) 7)  Hyperlipidemia  (ICD-272.4) 8)  Diabetes Mellitus, Type II  (ICD-250.00) 9)  Coronary Artery Disease  (ICD-414.00)  Current Medications (verified): 1)  Aspirin Ec 325 Mg  Tbec (Aspirin) .... Bid 2)  Enalapril Maleate 20 Mg  Tabs (Enalapril Maleate) .Marland Kitchen.. 1 By Mouth Twice Daily 3)  Metformin Hcl 1000 Mg Tabs (Metformin Hcl) .... Take 1 Tablet By Mouth Twice A Day 4)  Metoprolol Tartrate 50 Mg Tabs (Metoprolol Tartrate) .... Take 1 Tablet By Mouth Twice A Day 5)  Simvastatin 80 Mg Tabs (Simvastatin) .... Take  1 Tablet By Mouth At Bedtime  Allergies (verified): No Known Drug Allergies  Past History:  Past Medical History: Last updated: 01/27/2009 Coronary artery disease  (CABG in 1991. Myocardial infarction with Taxus stenting to a native vessel in 2005) Diabetes mellitus, type II 2005 Hyperlipidemia Myocardial infarction, hx of 1991 Cerebrovascular accident, hx of 04/01 TCCA of bladder Positive PPD  Past Surgical History: Last updated: 01/27/2009 Appendectomy, as child Coronary artery bypass graft 1991 Inguinal herniorrhaphy 1985  Social History: Last updated: 09/19/2006 Retired Current Smoker Regular exercise-no  Risk Factors: Exercise: no (09/19/2006)  Risk Factors: Smoking Status: current (05/25/2009) Packs/Day: 1/2 (08/21/2006) Cigars/wk: 14 (05/25/2009)  Physical Exam  General:  alert, well-developed, well-nourished, and well-hydrated, no distress Head:  normocephalic and atraumatic.   Eyes:  pupils equal and pupils round.   Ears:  R ear normal and L ear normal.   Nose:  no external deformity and no external erythema.   Neck:  supple, full ROM, and no masses.   Chest Wall:  No deformities, masses, tenderness or gynecomastia noted. Lungs:  normal respiratory effort, no intercostal retractions, no accessory muscle use, normal breath sounds, no dullness, no crackles, and no wheezes.   Heart:  normal rate and regular rhythm.   Abdomen:  soft, non-tender, normal bowel sounds, no distention, no masses, no  guarding, no rigidity, and no rebound tenderness.   overweight Msk:  No deformity or scoliosis noted of thoracic or lumbar spine.    Neurologic:  cranial nerves II-XII intact and gait normal.   Skin:  turgor normal and color normal.   Cervical Nodes:  no anterior cervical adenopathy and no posterior cervical adenopathy.   Psych:  memory intact for recent and remote and normally interactive.     Impression & Recommendations:  Problem # 1:  KNEE PAIN, LEFT,  CHRONIC (ICD-719.46)  chronic problem has had orthor review 3 years ago---needs to go back His updated medication list for this problem includes:    Aspirin Ec 325 Mg Tbec (Aspirin) ..... Bid  Orders: Orthopedic Referral (Ortho)  Problem # 2:  BLADDER CANCER (ICD-188.9) f/u urology  Problem # 3:  ESSENTIAL HYPERTENSION, BENIGN (ICD-401.1) reasonable control His updated medication list for this problem includes:    Enalapril Maleate 20 Mg Tabs (Enalapril maleate) .Marland Kitchen... 1 by mouth twice daily    Metoprolol Tartrate 50 Mg Tabs (Metoprolol tartrate) .Marland Kitchen... Take 1 tablet by mouth twice a day  BP today: 140/78 Prior BP: 158/85 (01/27/2009)  Prior 10 Yr Risk Heart Disease: N/A (09/19/2006)  Labs Reviewed: K+: 4.2 (05/18/2009) Creat: : 1.0 (05/18/2009)   Chol: 111 (05/18/2009)   HDL: 37.30 (05/18/2009)   LDL: 39 (05/18/2009)   TG: 172.0 (05/18/2009)  Problem # 4:  CORONARY ARTERY DISEASE (ICD-414.00) no sxs continue current medications  His updated medication list for this problem includes:    Aspirin Ec 325 Mg Tbec (Aspirin) ..... Bid    Enalapril Maleate 20 Mg Tabs (Enalapril maleate) .Marland Kitchen... 1 by mouth twice daily    Metoprolol Tartrate 50 Mg Tabs (Metoprolol tartrate) .Marland Kitchen... Take 1 tablet by mouth twice a day  Problem # 5:  DIABETES MELLITUS, TYPE II (ICD-250.00) advised regurlar exercise and diet His updated medication list for this problem includes:    Aspirin Ec 325 Mg Tbec (Aspirin) ..... Bid    Enalapril Maleate 20 Mg Tabs (Enalapril maleate) .Marland Kitchen... 1 by mouth twice daily    Metformin Hcl 1000 Mg Tabs (Metformin hcl) .Marland Kitchen... Take 1 tablet by mouth twice a day  Labs Reviewed: Creat: 1.0 (05/18/2009)     Last Eye Exam: normal (03/09/2009) Reviewed HgBA1c results: 7.5 (05/18/2009)  7.1 (11/03/2008)  Complete Medication List: 1)  Aspirin Ec 325 Mg Tbec (Aspirin) .... Bid 2)  Enalapril Maleate 20 Mg Tabs (Enalapril maleate) .Marland Kitchen.. 1 by mouth twice daily 3)  Metformin Hcl 1000  Mg Tabs (Metformin hcl) .... Take 1 tablet by mouth twice a day 4)  Metoprolol Tartrate 50 Mg Tabs (Metoprolol tartrate) .... Take 1 tablet by mouth twice a day 5)  Simvastatin 80 Mg Tabs (Simvastatin) .... Take 1 tablet by mouth at bedtime  Patient Instructions: 1)  Please schedule a follow-up appointment in 6 months. CPX Prescriptions: SIMVASTATIN 80 MG TABS (SIMVASTATIN) Take 1 tablet by mouth at bedtime  #90 x 4   Entered and Authorized by:   Birdie Sons MD   Signed by:   Birdie Sons MD on 05/25/2009   Method used:   Faxed to ...       [facility] (retail)             , [place]         Ph: [phone]       Fax: [phone]   RxID:   [id number]

## 2010-03-23 NOTE — Assessment & Plan Note (Signed)
Summary: emp---will fast//ccm   Vital Signs:  Patient profile:   65 year old male Height:      65.75 inches Weight:      174 pounds BMI:     28.40 Temp:     98.9 degrees F oral Pulse rate:   64 / minute BP sitting:   130 / 82  (left arm) Cuff size:   regular  Vitals Entered By: Alfred Levins, CMA (December 14, 2009 10:01 AM) CC: cpx, fasting   Primary Care Evonne Rinks:  Birdie Sons MD  CC:  cpx and fasting.  History of Present Illness: Here for Medicare AWV:  1.   Risk factors based on Past M, S, F history:---see list 2.   Physical Activities: --able to do all, plays golf regularly 3.   Depression/mood: ---denies 4.   Hearing: ---no complaints 5.   ADL's: ---able to do all 6.   Fall Risk: ---none noted 7.   Home Safety: --no concerns 8.   Height, weight, &visual acuity:see list, annual eye exam 9.   Counseling: -- stop smoking, exercise daily 10.   Labs ordered based on risk factors: -- see orders 11.           Referral Coordination-- none necessary 12.           Care Plan--daily exercise 13.            Cognitive Assessment --no motor or sensory deficits. mental status normal.   Current Problems:  TOBACCO USE (ICD-305.1)--not interested in quitting ESSENTIAL HYPERTENSION, BENIGN (ICD-401.1)---tolerating meds BLADDER CANCER (ICD-188.9)---see urology q 6 months CEREBROVASCULAR ACCIDENT, HX OF (ICD-V12.50)---no recurrence HYPERLIPIDEMIA (ICD-272.4)---tolerating meds DIABETES MELLITUS, TYPE II (ICD-250.00)----no meds CORONARY ARTERY DISEASE (ICD-414.00)---no CP, SOB, PND.  All other systems reviewed and were negative      Preventive Screening-Counseling & Management  Alcohol-Tobacco     Smoking Cessation Counseling: yes  Current Problems (verified): 1)  Preventive Health Care  (ICD-V70.0) 2)  Tobacco Use  (ICD-305.1) 3)  Essential Hypertension, Benign  (ICD-401.1) 4)  Bladder Cancer  (ICD-188.9) 5)  Cerebrovascular Accident, Hx of  (ICD-V12.50) 6)  Myocardial  Infarction, Hx of  (ICD-412) 7)  Hyperlipidemia  (ICD-272.4) 8)  Diabetes Mellitus, Type II  (ICD-250.00) 9)  Coronary Artery Disease  (ICD-414.00)  Current Medications (verified): 1)  Aspirin Ec 325 Mg  Tbec (Aspirin) .... Bid 2)  Enalapril Maleate 20 Mg  Tabs (Enalapril Maleate) .Marland Kitchen.. 1 By Mouth Twice Daily 3)  Metformin Hcl 1000 Mg Tabs (Metformin Hcl) .... Take 1 Tablet By Mouth Twice A Day 4)  Metoprolol Tartrate 50 Mg Tabs (Metoprolol Tartrate) .... Take 1 Tablet By Mouth Twice A Day 5)  Simvastatin 80 Mg Tabs (Simvastatin) .... Take 1 Tablet By Mouth At Bedtime  Allergies (verified): No Known Drug Allergies  Past History:  Past Medical History: Last updated: 01/27/2009 Coronary artery disease  (CABG in 1991. Myocardial infarction with Taxus stenting to a native vessel in 2005) Diabetes mellitus, type II 2005 Hyperlipidemia Myocardial infarction, hx of 1991 Cerebrovascular accident, hx of 04/01 TCCA of bladder Positive PPD  Past Surgical History: Last updated: 01/27/2009 Appendectomy, as child Coronary artery bypass graft 1991 Inguinal herniorrhaphy 1985  Social History: Last updated: 09/19/2006 Retired Current Smoker Regular exercise-no  Risk Factors: Exercise: no (09/19/2006)  Risk Factors: Smoking Status: current (05/25/2009) Packs/Day: 1/2 (08/21/2006) Cigars/wk: 14 (05/25/2009)  Physical Exam  General:  well-developed well-nourished male in no acute distress. HEENT exam atraumatic, normocephalic symmetric her muscles are intact. Neck is supple  without lymphadenopathy. Chest clear auscultation. Cardiac exam S1-S2 are regular. Abdominal exam active bowel sounds, soft. Extremities there is no clubbing cyanosis or edema. Neurologic exam alert and oriented. GU exam and rectal exam and prostate exam per urology.   Impression & Recommendations:  Problem # 1:  Preventive Health Care (ICD-V70.0)  health maintenance up-to-date. Tobacco cessation discussed.  Regular exercise discussed. I've encouraged him to try to maintain a healthy lifestyle including low fat, low calorie diet. Regular exercise. He should exercise at least 40 minutes every day.  Problem # 2:  TOBACCO USE (ICD-305.1)  Encouraged smoking cessation and discussed different methods for smoking cessation.   Problem # 3:  BLADDER CANCER (ICD-188.9)  dr grapey--f/u q 6 months  Orders: Specimen Handling (69629) TLB-CBC Platelet - w/Differential (85025-CBCD)  Problem # 4:  ESSENTIAL HYPERTENSION, BENIGN (ICD-401.1)  fair control  His updated medication list for this problem includes:    Enalapril Maleate 20 Mg Tabs (Enalapril maleate) .Marland Kitchen... 1 by mouth twice daily    Metoprolol Tartrate 50 Mg Tabs (Metoprolol tartrate) .Marland Kitchen... Take 1 tablet by mouth twice a day  BP today: 130/82 Prior BP: 140/78 (05/25/2009)  Prior 10 Yr Risk Heart Disease: N/A (09/19/2006)  Labs Reviewed: K+: 4.2 (05/18/2009) Creat: : 1.0 (05/18/2009)   Chol: 111 (05/18/2009)   HDL: 37.30 (05/18/2009)   LDL: 39 (05/18/2009)   TG: 172.0 (05/18/2009)  Orders: Venipuncture (52841) Specimen Handling (32440) TLB-BMP (Basic Metabolic Panel-BMET) (80048-METABOL)  Problem # 5:  CORONARY ARTERY DISEASE (ICD-414.00)  no sxs continue current medications  His updated medication list for this problem includes:    Aspirin Ec 325 Mg Tbec (Aspirin) ..... Bid    Enalapril Maleate 20 Mg Tabs (Enalapril maleate) .Marland Kitchen... 1 by mouth twice daily    Metoprolol Tartrate 50 Mg Tabs (Metoprolol tartrate) .Marland Kitchen... Take 1 tablet by mouth twice a day  Labs Reviewed: Chol: 111 (05/18/2009)   HDL: 37.30 (05/18/2009)   LDL: 39 (05/18/2009)   TG: 172.0 (05/18/2009)  Lipid Goals: Chol Goal: 200 (09/19/2006)   HDL Goal: 40 (09/19/2006)   LDL Goal: 70 (09/19/2006)   TG Goal: 150 (09/19/2006)  Complete Medication List: 1)  Aspirin Ec 325 Mg Tbec (Aspirin) .... Bid 2)  Enalapril Maleate 20 Mg Tabs (Enalapril maleate) .Marland Kitchen.. 1 by mouth  twice daily 3)  Metformin Hcl 1000 Mg Tabs (Metformin hcl) .... Take 1 tablet by mouth twice a day 4)  Metoprolol Tartrate 50 Mg Tabs (Metoprolol tartrate) .... Take 1 tablet by mouth twice a day 5)  Simvastatin 80 Mg Tabs (Simvastatin) .... Take 1 tablet by mouth at bedtime  Other Orders: Welcome to [employer], Physical [phone]) TLB-Lipid Panel (80061-LIPID) TLB-Hepatic/Liver Function Pnl (80076-HEPATIC) TLB-TSH (Thyroid Stimulating Hormone) (84443-TSH) TLB-A1C / Hgb A1C (Glycohemoglobin) (83036-A1C)  Patient Instructions: 1)  Call your insurance company about shingles shot (zostavax). 2)  Please schedule a follow-up appointment in 6 months. 3)  labs one week prior to visit 4)  lipids---272.4 5)  lfts-995.2 6)  bmet-995.2 7)  A1C-250.02 8)        Orders Added: 1)  Welcome to Medicare, Physical [G0402] 2)  Est. Patient Level IV [53664] 3)  Venipuncture [40347] 4)  Specimen Handling [99000] 5)  TLB-BMP (Basic Metabolic Panel-BMET) [80048-METABOL] 6)  TLB-Lipid Panel [80061-LIPID] 7)  TLB-Hepatic/Liver Function Pnl [80076-HEPATIC] 8)  TLB-TSH (Thyroid Stimulating Hormone) [84443-TSH] 9)  TLB-CBC Platelet - w/Differential [85025-CBCD] 10)  TLB-A1C / Hgb A1C (Glycohemoglobin) [83036-A1C]   Immunization History:  Influenza Immunization History:  Influenza:  fluvax mcr (11/21/2009)   Immunization History:  Influenza Immunization History:    Influenza:  Fluvax MCR (11/21/2009)  Prevention & Chronic Care Immunizations   Influenza vaccine: Fluvax MCR  (11/21/2009)    Tetanus booster: 12/22/2005: Tdap    Pneumococcal vaccine: Pneumovax  (11/05/2007)    H. zoster vaccine: Not documented  Colorectal Screening   Hemoccult: Not documented    Colonoscopy: Location:  [facility].    (05/19/2008)   Colonoscopy due: 05/2013  Other Screening   PSA: 0.44  (12/01/2005)   PSA action/deferral: Discussed-PSA declined  (12/14/2009)   Smoking status: current   (05/25/2009)   Smoking cessation counseling: yes  (12/14/2009)  Diabetes Mellitus   HgbA1C: 7.5  (05/18/2009)    Eye exam: normal  (03/09/2009)   Eye exam due: 07/2008    Foot exam: yes  (11/05/2007)   High risk foot: Not documented   Foot care education: Not documented    Urine microalbumin/creatinine ratio: Not documented  Lipids   Total Cholesterol: 111  (05/18/2009)   LDL: 39  (05/18/2009)   LDL Direct: Not documented   HDL: 37.30  (05/18/2009)   Triglycerides: 172.0  (05/18/2009)    SGOT (AST): 22  (05/18/2009)   SGPT (ALT): 17  (05/18/2009)   Alkaline phosphatase: 58  (05/18/2009)   Total bilirubin: 0.3  (05/18/2009)  Hypertension   Last Blood Pressure: 130 / 82  (12/14/2009)   Serum creatinine: 1.0  (05/18/2009)   Serum potassium 4.2  (05/18/2009)  Self-Management Support :    Diabetes self-management support: Not documented    Hypertension self-management support: Not documented    Lipid self-management support: Not documented   Physical Exam General Appearance: well developed, well nourished, no acute distress Eyes: conjunctiva and lids normal, PERRL, EOMI,  Ears, Nose, Mouth, Throat: TM clear, nares clear, oral exam WNL. Poor dentition Neck: supple, no lymphadenopathy, no thyromegaly, no JVD Respiratory: clear to auscultation and percussion, respiratory effort normal Cardiovascular: regular rate and rhythm, S1-S2, no murmur, rub or gallop, no bruits, peripheral pulses normal and symmetric, no cyanosis, clubbing, edema or varicosities Chest: no scars, masses, tenderness; no asymmetry, skin changes, nipple discharge, no gynecomastia   Gastrointestinal: soft, non-tender; no hepatosplenomegaly, masses; active bowel sounds all quadrants,   Genitourinary: dr Isabel Caprice Lymphatic: no cervical, axillary or inguinal adenopathy Musculoskeletal: gait normal, muscle tone and strength WNL, no joint swelling, effusions, discoloration, crepitus  Skin: clear, good turgor,  color WNL, no rashes, lesions, or ulcerations Neurologic: normal mental status, normal reflexes, normal strength, sensation, and motion Psychiatric: alert; oriented to person, place and time Other Exam:      Laboratory Results   Urine Tests    Routine Urinalysis   Color: yellow Appearance: Clear Glucose: negative   (Normal Range: Negative) Bilirubin: negative   (Normal Range: Negative) Ketone: negative   (Normal Range: Negative) Spec. Gravity: 1.015   (Normal Range: 1.003-1.035) Blood: trace-lysed   (Normal Range: Negative) pH: 5.0   (Normal Range: 5.0-8.0) Protein: negative   (Normal Range: Negative) Urobilinogen: 0.2   (Normal Range: 0-1) Nitrite: negative   (Normal Range: Negative) Leukocyte Esterace: negative   (Normal Range: Negative)    Comments: Rita Ohara  December 14, 2009 1:39 PM

## 2010-04-07 ENCOUNTER — Other Ambulatory Visit (INDEPENDENT_AMBULATORY_CARE_PROVIDER_SITE_OTHER): Payer: Medicare Other | Admitting: Internal Medicine

## 2010-04-07 DIAGNOSIS — E785 Hyperlipidemia, unspecified: Secondary | ICD-10-CM

## 2010-04-07 DIAGNOSIS — T887XXA Unspecified adverse effect of drug or medicament, initial encounter: Secondary | ICD-10-CM

## 2010-04-07 DIAGNOSIS — E119 Type 2 diabetes mellitus without complications: Secondary | ICD-10-CM

## 2010-04-07 LAB — HEPATIC FUNCTION PANEL
AST: 26 U/L (ref 0–37)
Albumin: 4.4 g/dL (ref 3.5–5.2)
Alkaline Phosphatase: 50 U/L (ref 39–117)
Total Protein: 7.4 g/dL (ref 6.0–8.3)

## 2010-04-07 LAB — BASIC METABOLIC PANEL
BUN: 14 mg/dL (ref 6–23)
Potassium: 4.7 mEq/L (ref 3.5–5.1)
Sodium: 137 mEq/L (ref 135–145)

## 2010-04-07 LAB — LDL CHOLESTEROL, DIRECT: Direct LDL: 70 mg/dL

## 2010-04-07 LAB — HEMOGLOBIN A1C: Hgb A1c MFr Bld: 8.1 % — ABNORMAL HIGH (ref 4.6–6.5)

## 2010-04-07 LAB — LIPID PANEL: HDL: 36 mg/dL — ABNORMAL LOW (ref >39.00)

## 2010-04-12 LAB — HM DIABETES EYE EXAM

## 2010-04-13 ENCOUNTER — Encounter: Payer: Self-pay | Admitting: Internal Medicine

## 2010-04-14 ENCOUNTER — Encounter: Payer: Self-pay | Admitting: Internal Medicine

## 2010-04-14 ENCOUNTER — Ambulatory Visit (INDEPENDENT_AMBULATORY_CARE_PROVIDER_SITE_OTHER): Payer: Medicare Other | Admitting: Internal Medicine

## 2010-04-14 ENCOUNTER — Ambulatory Visit: Payer: Self-pay | Admitting: Internal Medicine

## 2010-04-14 DIAGNOSIS — I2510 Atherosclerotic heart disease of native coronary artery without angina pectoris: Secondary | ICD-10-CM

## 2010-04-14 DIAGNOSIS — Z8679 Personal history of other diseases of the circulatory system: Secondary | ICD-10-CM

## 2010-04-14 DIAGNOSIS — E785 Hyperlipidemia, unspecified: Secondary | ICD-10-CM

## 2010-04-14 DIAGNOSIS — F17200 Nicotine dependence, unspecified, uncomplicated: Secondary | ICD-10-CM

## 2010-04-14 DIAGNOSIS — C679 Malignant neoplasm of bladder, unspecified: Secondary | ICD-10-CM

## 2010-04-14 DIAGNOSIS — E119 Type 2 diabetes mellitus without complications: Secondary | ICD-10-CM

## 2010-04-14 DIAGNOSIS — I10 Essential (primary) hypertension: Secondary | ICD-10-CM

## 2010-04-14 MED ORDER — GLIMEPIRIDE 2 MG PO TABS: 2.0000 mg | ORAL_TABLET | ORAL | Status: DC

## 2010-04-14 NOTE — Assessment & Plan Note (Signed)
Adequate control Continue same meds 

## 2010-04-14 NOTE — Assessment & Plan Note (Signed)
No sx's 

## 2010-04-14 NOTE — Assessment & Plan Note (Signed)
No sxs Continue same meds 

## 2010-04-14 NOTE — Assessment & Plan Note (Signed)
Advised complete cessation.  

## 2010-04-14 NOTE — Assessment & Plan Note (Signed)
Continue curent meds except add glimeperide Side effects discussed

## 2010-04-14 NOTE — Progress Notes (Signed)
  Subjective:    Patient ID: Benjamin Browning, male    DOB: May 06, 1945, 65 y.o.   MRN: [id number]  HPI   patient comes in for followup of multiple medical problems including type 2 diabetes, hyperlipidemia, hypertension. The patient does not check blood sugar or blood pressure at home. The patetient does not follow an exercise or diet program. The patient denies any polyuria, polydipsia.  In the past the patient has gone to diabetic treatment center. The patient is tolerating medications  Without difficulty. The patient does admit to medication compliance.   Pt with CAD---no sxs Pt with hx of stroke---no recurrence  Past Medical History  Diagnosis Date  . CAD (coronary artery disease)   . Heart attack 2005    w/ Taxus stenting to a native vessel   . Diabetes mellitus 2005    type 2  . Hyperlipidemia   . Heart attack 1991  . Cerebrovascular accident 4/01  . Positive PPD    Past Surgical History  Procedure Date  . Coronary artery bypass graft 1991  . Appendectomy     as child  . Inguinal hernia repair 1985    reports that he has been smoking Cigars.  He does not have any smokeless tobacco history on file. His alcohol and drug histories not on file. family history is not on file.    No Known Allergies    Review of Systems  patient denies chest pain, shortness of breath, orthopnea. Denies lower extremity edema, abdominal pain, change in appetite, change in bowel movements. Patient denies rashes, musculoskeletal complaints. No other specific complaints in a complete review of systems.      Objective:   Physical Exam  well-developed well-nourished male in no acute distress. HEENT exam atraumatic, normocephalic, neck supple without jugular venous distention. Chest clear to auscultation cardiac exam S1-S2 are regular. Abdominal exam overweight with bowel sounds, soft and nontender. Extremities no edema. Neurologic exam is alert with a normal gait.        Assessment & Plan:

## 2010-04-24 ENCOUNTER — Encounter: Payer: Self-pay | Admitting: Internal Medicine

## 2010-06-03 LAB — GLUCOSE, CAPILLARY: Glucose-Capillary: 152 mg/dL — ABNORMAL HIGH (ref 70–99)

## 2010-07-06 NOTE — Procedures (Signed)
[facility]                              EXERCISE TREADMILL   NAME:[name], Benjamin Browning                       MRN:          [id number]  DATE:12/11/2007                            DOB:          [birth year]    PROCEDURE:  Exercise treadmill test.   INDICATIONS:  The patient with coronary artery disease, status post  coronary artery bypass graft.   PROCEDURE NOTE:  The patient exercised using standard Bruce protocol.  He was able to exercise for 8 minutes and 15 seconds.  This was 10  minutes and 15 seconds into stage III.  The test was terminated because  of fatigue.  He had achieved his target heart rate as well.  He had a  peak heart rate of 148 which was 94% predicted.  He achieved 10.4 METS.  He had a blood pressure response which was appropriate peaking at  194/90.  He had no ischemic ST-T wave changes.  He had no arrhythmias.  He had normal heart rate recovery.   CONCLUSION:  Negative adequate exercise treadmill test.  There is no  evidence for high-grade obstructive coronary artery disease, residual  from his bypass.  He has a reasonable exercise tolerance.   PLAN:  Based on the above, I did give him a prescription for exercise.   NOTE:  This test was done on metoprolol.  I would suggest that he tries  to achieve a 13 on the Borg scale and I described this to him.  Probably  would not achieve his target heart rate without overexerting himself.  I  have reviewed his lipid trends, hemoglobin A1c trends, and blood  pressure trends to reinforce secondary risk reduction.  He will see me  again in a year and we will continue to follow up closely with Dr.  Cato Mulligan.     Rollene Rotunda, MD, [facility]  Electronically Signed    JH/MedQ  DD: 12/11/2007  DT: 12/12/2007  Job #: [id number]   cc:   Valetta Mole. Swords, MD

## 2010-07-06 NOTE — Assessment & Plan Note (Signed)
[facility]                            CARDIOLOGY OFFICE NOTE   NAME:Benjamin Browning, Benjamin Browning                       MRN:          [id number]  DATE:10/23/2007                            DOB:          [birth year]    PRIMARY CARE PHYSICIAN:  Valetta Mole. Swords, MD   REASON FOR PRESENTATION:  Evaluate the patient with coronary artery  disease.   HISTORY OF PRESENT ILLNESS:  The patient is a pleasant 65 year old  gentleman who comes back for 62-month followup.  Since I last saw him,  he has had no cardiovascular complaints.  He follows with [name] and  does have his lipids checked.  I did review this and found that his last  lipid profile demonstrated an LDL that was only 56.  However, his HDL  was 29.  His hemoglobin A1c was 7.4.  He does golf twice a week.  However, he is not exercising routinely.   With his activities of daily living, he denies any chest discomfort,  neck discomfort, arm discomfort, activity-induced nausea, vomiting, or  excessive diaphoresis.  He has had no palpitations, presyncope or  syncope.  He has had no PND or orthopnea.   PAST MEDICAL HISTORY:  Coronary artery disease (CABG in 1991.  Myocardial infarction with stenting to the native vessel in 2005.  All  of this was in [place].  I do not have a written report, but do have a  CD of his anatomy), dyslipidemia, bladder tumors with transurethral  resection of the bladder, cystoscopy, and tonsillectomy.   ALLERGIES:  None.   MEDICATIONS:  1. Aspirin 325 mg daily.  2. Zocor 80 mg daily.  3. Enalapril 2.5 mg daily.  4. Metformin 1000 mg b.i.d.  5. Toprol 25 mg b.i.d.   REVIEW OF SYSTEMS:  As stated in the HPI and otherwise negative for  other systems.   PHYSICAL EXAMINATION:  GENERAL:  The patient is in no distress.  VITAL SIGNS:  Blood pressure is 138/82, heart rate 68 and regular,  weight 180 pounds, and body mass index 29.  HEENT:  Eyes unremarkable; pupils equal, round, and  reactive to light;  fundi not visualized; oral mucosa remarkable.  NECK:  No jugular venous distention at 45 degrees; carotid upstroke  brisk and symmetrical; no bruits, no thyromegaly.  LYMPHATICS:  No cervical, axillary, or inguinal adenopathy.  LUNGS:  Clear to auscultation bilaterally.  BACK:  No costovertebral angle tenderness.  CHEST:  Unremarkable.  HEART:  PMI not displaced or sustained; S1 and S2 within normal limits;  no S3, no S4; no clicks, no rubs, no murmurs.  ABDOMEN:  Obese; positive bowel sounds, normal in frequency and pitch;  no bruits, no rebound, no guarding; no midline pulsatile mass, no  organomegaly.  SKIN:  No rashes, no nodules.  EXTREMITIES:  Pulses 2+ throughout, no edema.  NEUROLOGIC:  Oriented to person, place, and time; cranial nerves II  through XII grossly intact; motor grossly intact.   LABORATORY DATA:  EKG sinus rhythm, rate 64, old inferior infarct, no  acute ST-T wave changes.   ASSESSMENT AND  PLAN:  1. Coronary artery disease.  The patient has 65 year old bypass      grafts.  He is not having any anginal symptoms, though he is not      exerting himself overly.  At this point, we are going to put him on      treadmill for a plain old exercise treadmill test (POET).  This      will allow me to rule out any new high-grade obstructive coronary      artery disease, risk stratify, and most importantly for this      patient give him a prescription for exercise.  We discussed this in      great length.  2. Dyslipidemia.  His HDL is very low.  He would benefit from a      combination therapy.  First, I think it is reasonable to get him on      exercise regimen to see if this improves.  The goal should be an      LDL less than 70 and an HDL greater than 40.  3. Diabetes.  Hemoglobin A1c is 7.4.  This would be followed by Dr.      Cato Mulligan.  4. Weight.  He is not obese, and his weight is stable, but he should      lose some weight to be closer to  target.  5. Hypertension.  Blood pressure is controlled.  He will continue on      the medications as listed.  6. Followup.  I will see him at the time of his plain old exercise      test (POET).     Rollene Rotunda, MD, [facility]  Electronically Signed    JH/MedQ  DD: 10/23/2007  DT: 10/24/2007  Job #: [id number]   cc:   Valetta Mole. Swords, MD

## 2010-07-09 NOTE — Assessment & Plan Note (Signed)
[facility]                            CARDIOLOGY OFFICE NOTE   NAME:Benjamin Browning, Benjamin Browning                       MRN:          [id number]  DATE:03/02/2006                            DOB:          03/22/1945    PRIMARY CARE PHYSICIAN:  Dr. Birdie Sons   REASON FOR PRESENTATION:  The patient with coronary disease.   HISTORY OF PRESENT ILLNESS:  The patient is a pleasant 65 year old  gentleman with a history of coronary disease as described below.  In the  last year he has done well. He has had no further cardiovascular  symptoms.  He has no chest discomfort, which was his initial MI  complaint.  He had no arm discomfort which was a complaint he had in  2005 when he had a stent in [place].  He has had no shortness of breath,  denies any PND or orthopnea.  He stopped walking on his exercise  treadmill unfortunately.  He gets no discomfort with the routine  activities of daily living.  He denies any activity induced nausea and  vomiting, excessive diaphoresis.  He has had no palpitations, presyncope  or syncope.   PAST MEDICAL HISTORY:  Coronary artery disease (CABG in 1991.  Myocardial infarction with Taxus stenting to a native vessel in 2005),  dyslipidemia, bladder tumors with transurethral resection of the  bladder, cystoscopy, tonsillectomy.   ALLERGIES:  None.   MEDICATIONS:  1. Aspirin 325 mg daily.  2. Metoprolol 50 mg daily.  3. Zocor 80 mg daily.  4. Enalapril 2.50 mg daily.  5. Metformin 500 mg b.i.d.   REVIEW OF SYSTEMS:  As stated in the HPI, otherwise negative for other  systems.   PHYSICAL EXAMINATION:  The patient is in no distress.  Blood pressure 144/87, heart rate 68 and regular.  Weight 180 pounds.  Body mass index 28.  HEENT:  Eyes unremarkable.  Pupils equal, round, reactive to light,  fundi not visualized.  Oral mucosa unremarkable.  NECK:  No jugular venous distention.  Wave form within normal limits.  Carotid upstroke  brisk and symmetric, no bruits, no thyromegaly.  LYMPHATICS:  No cervical, axillary, inguinal adenopathy.  LUNGS:  Clear to auscultation bilaterally.  BACK:  No costovertebral angle tenderness.  CHEST:  Unremarkable.  HEART:  PMI non-displaced or sustained.  S1 and S2 within normal limits.  No S3, no S4, no murmurs.  ABDOMEN:  Obese, positive bowel sounds.  Normal in frequency and pitch,  no bruits, no rebound, no guarding, no midline pulse, no  hepatosplenomegaly, no splenomegaly.  SKIN:  No rashes.  Denies.  EXTREMITIES:  2+ pulses, no edema.  No cyanosis or clubbing.  NEURO:  Oriented to person, place and time.  Cranial nerves II through  XII grossly intact, motor grossly intact.   EKG:  Sinus rhythm, rate 68, no acute ST-T wave changes.   ASSESSMENT AND PLAN:  1. Coronary disease.  The patient is having no ongoing symptoms.  No      further cardiovascular testing is suggested.  We spent a lot of  time discussing secondary risk reduction.  Toward that end, I gave      him a prescription for exercise and explained to him the importance      of this and hopefully he will comply.  2. Dyslipidemia.  Mildly reduced HDL with the goal being greater than      40.  His LDL is near  target.  At this point walking may give him a      4 to 5 point bump in his HDL and again this was discussed with him.  3. Hypertension. His blood pressure is mildly elevated.  He has      diabetes, his set goal is 120/70.  I took the liberty of increasing      his enalapril to 5 mg daily.  He will have this followed closely by      [name].  4. Weight.  The patient's body mass index puts him in the overweight      range, though he is down 8 pounds.  We discussed this and I      congratulated him on the weight loss.  5. Followup.  I would like to see him back about every 18 months or      so, or sooner if he has any recurrent symptoms.     Rollene Rotunda, MD, [facility]  Electronically Signed     JH/MedQ  DD: 03/02/2006  DT: 03/02/2006  Job #: [id number]   cc:   Valetta Mole. Swords, MD

## 2010-07-09 NOTE — Op Note (Signed)
[place]  Patient:    Benjamin Browning, Benjamin Browning                       MRN: [id number] Proc. Date: 06/23/99 Adm. 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  OPERATION PERFORMED:  Cystoscopy with multiple bladder biopsies and fulguration.  SURGEON:  Barron Alvine, M.D.  ANESTHESIA:  General.  INDICATIONS FOR PROCEDURE:  The patient is a 65 year old male.  In mid-February of this year, the patient underwent a transurethral resection of multiple bladder tumors.  The most worrisome tumor was in the right lateral wall of his bladder.  This tumor appeared to be more poorly differentiated and the final pathology did reveal a foci of muscle invasive disease.  His other tumor was superficial, papillary and more well-differentiated.  The patient underwent extensive counseling with regard to his pathology findings.  We strongly recommended radical cystoprostatectomy with urinary diversion.  The patient refused that recommendation.  We did not feel that he was a good candidate for partial cystectomy given the multifocality of his disease.  We talked about the possibility of bladder sparing with chemotherapy and radiation but the patient elected to do nothing.  I did encourage him strongly to consider repeat biopsy of the resection site to be sure there was not residual tumor and he did agree to that and presents now for that.  DESCRIPTION OF PROCEDURE:  The patient was brought to the operating room where he had successful induction of general anesthesia.  He was placed in lithotomy position and prepped and draped in the usual manner.  Cystoscopy revealed a relatively unremarkable prostatic urethra.   The bladder mucosa itself showed no evidence of obvious recurrent tumor.  In the right lateral wall biopsy site, there was some increased erythema and some heaped up mucosa but again, no obvious tumor recurrence.  I ended up performing approximately six to seven cold cup biopsies of this area with attempts to try to get some underlying muscle as well.  This was then fulgurated aggressively.  We then biopsied the other tumor site and two separate biopsies were sent of the posterior wall. The patient tolerated the procedure well and bleeding was minimal.  He was brought to the recovery room in stable condition. DD:  06/23/99 TD:  06/24/99 Job: [id number]

## 2010-07-09 NOTE — Op Note (Signed)
NAME:  Benjamin Browning, Benjamin Browning                          ACCOUNT NO.:  [id number]   MEDICAL RECORD NO.:  [id number]                   PATIENT TYPE:  AMB   LOCATION:  [facility]                                 FACILITY:  [facility]   PHYSICIAN:  Valetta Fuller, M.D.               DATE OF BIRTH:  1945-03-10   DATE OF PROCEDURE:  03/11/2002  DATE OF DISCHARGE:                                 OPERATIVE REPORT   PREOPERATIVE DIAGNOSIS:  History of transitional cell carcinoma of the  bladder.   POSTOPERATIVE DIAGNOSIS:  History of transitional cell carcinoma of the  bladder.   PROCEDURE PERFORMED:  Cystoscopy with bladder biopsy and fulguration and  bladder barbotage for cytology.   SURGEON:  Valetta Fuller, M.D.   ANESTHESIA:  General.   INDICATIONS:  Mr. Hazelip is a 65 year old male.  He has a history of  transitional cell carcinoma.  He had a foci of muscle invasive disease and  carcinoma in situ, diagnosed in 2001.  On repeat resection, he had no  evidence of residual muscle invasive disease. The patient continued to have  carcinoma in situ and was treated with intravesical therapy and has had  maintenance therapy.  He has had never had any definite recurrence.  Of  note, the patient refused more aggressive therapy and refused radical  cystoprostatectomy.  The patient had a recent intravenous polygram which was  unremarkable.  On several recent cystoscopies, he has had a couple areas of  mild erythema.  Given his history, I have recommended that we do some random  bladder biopsies and biopsy these areas of erythema to rule out carcinoma in  situ.   TECHNIQUE AND FINDINGS:  The patient was brought to the operating room where  he had successful induction of general anesthetic.  He was placed in  lithotomy position and prepped and draped in the usual manner.  The patient  had some mild trilobar hyperplasia.  The bladder was endoscopically normal  with the exception of a couple of increased areas of  erythema on the  posterior wall and trigone.  The bladder was otherwise carefully inspected  with both lens systems.  We did cold cup biopsies x 2 of the posterior wall  and x 2 of the trigone which were then fulgurated.  Prior to the biopsies, a  saline bladder barbotage was also done for cytology.  The patient tolerated  the procedure well, and there were no obvious complications.                                               Valetta Fuller, M.D.    DSG/MEDQ  D:  03/12/2003  T:  03/12/2003  Job:  [id number]

## 2010-07-09 NOTE — Op Note (Signed)
[place]  Patient:    Benjamin Browning, Benjamin Browning                         MRN: [id number] Proc. Date: 04/12/99 Attending:  Barron Alvine, M.D. CC:         Benjamin Browning, M.D. [facility]                           Operative Report  PREOPERATIVE DIAGNOSIS: 1. Multiple bladder tumors. 2. Gross hematuria.  POSTOPERATIVE DIAGNOSIS: 1. Multiple bladder tumors. 2. Gross hematuria.  OPERATION:  Transurethral resection of bladder tumor.  SURGEON:  Barron Alvine, M.D.  ANESTHESIA:  General.  INDICATIONS:  Benjamin Browning is a 65 year old male.  Approximately eight months ago, he had an episode of self-limited gross hematuria and did not seek medical attention. He has been noted to have recurrent hematuria.  Benjamin Browning ordered a non-contrast CT urogram which revealed two soft tissue masses in the bladder consistent with  transitional cell carcinoma.   The patient now presents for cystoscopy and further evaluation, biopsy, and possible resection of these masses.  The patient understands there is an extremely high likelihood that these represent malignant transitional cell carcinomas of the bladder.  Before informed consent was obtained, the purpose and technique advised.  DESCRIPTION OF PROCEDURE:  The patient was brought to the operating room where e underwent successful induction of general anesthesia.  He as placed in the mid lithotomy position and prepped and draped in the usual manner.  We went ahead and placed a 27-French continuous flow resectoscope sheath and then carefully inspected the bladder and prostatic urethra.  Upon entering the bladder, it was obvious that the patient had multiple bladder tumors.  The largest of these measured approximately 6 to 7 cm in size and was coming off the right lateral wall at the junction of the base of the bladder.  This was just cranial to the ureteral orifice on the right.  In addition, there was a second papillary  tumor just superior to the left ureteral orifice, and a third, smaller, 5 to 6 mm tumor on the lateral wall was the last.  The largest tumor did not appear to be muscle invasive and did appear to come down to a relatively narrow stalk but did not have the papillary  appearance of the other tumors.  I would estimate this was a higher-grade lesion. There was also evidence of neovascularity with quite a few additional blood vessels near the base of this tumor.  The other tumors were much more standard low-grade papillary-appearing tumors.  We initiated resection of the right-sided tumor.  We were able to resect the tumor down to close to the base.  I sent the superficial bite separately and took deeper bites through the base of the tumor.  Again, because of the neovascularity, there was a fair amount of bleeding, but it was easily controlled with the resectoscope. While the bladder was thin, no evidence of perforation occurred.  That whole specimen was sent in two portions separately.  We then turned attention to the right-sided tumor near the right ureteral orifice. This was resected and sent separately.  We then resected the last tumor.  There was really no evidence of significant bladder injury.  Hemostasis was excellent at he completion of the procedure.  All of these tumors came close to the ureteral orifice.  We were able to stay  at least 0.5 cm away on both sides, and there was no evidence of injury to the ureters.  We did not feel JJ stent placement was necessary.  At the completion of the procedure, we placed a 22-French Foley catheter.  I carefully inspected the rest of the bladder and did not see evidence of other tumors.  The prostatic urethra showed no evidence of tumor.  Due to the multifocal nature of the tumors, we did not feel that additional random biopsies were really necessary but will plan on rescoping this gentleman in six to eight  weeks to have a  second look, especially if there is any question of superficial or deeper invasion. DD:  04/12/99 TD:  04/12/99 Job: [id number]

## 2010-08-03 ENCOUNTER — Other Ambulatory Visit (INDEPENDENT_AMBULATORY_CARE_PROVIDER_SITE_OTHER): Payer: Medicare Other

## 2010-08-03 DIAGNOSIS — Z0000 Encounter for general adult medical examination without abnormal findings: Secondary | ICD-10-CM

## 2010-08-03 LAB — HEPATIC FUNCTION PANEL
ALT: 26 U/L (ref 0–53)
AST: 31 U/L (ref 0–37)
Total Bilirubin: 0.7 mg/dL (ref 0.3–1.2)

## 2010-08-03 LAB — BASIC METABOLIC PANEL
CO2: 23 mEq/L (ref 19–32)
Calcium: 9.4 mg/dL (ref 8.4–10.5)
Creatinine, Ser: 1.1 mg/dL (ref 0.4–1.5)

## 2010-08-03 LAB — HEMOGLOBIN A1C: Hgb A1c MFr Bld: 6.8 % — ABNORMAL HIGH (ref 4.6–6.5)

## 2010-08-03 LAB — LIPID PANEL: Triglycerides: 317 mg/dL — ABNORMAL HIGH (ref 0.0–149.0)

## 2010-08-18 ENCOUNTER — Ambulatory Visit: Payer: BC Managed Care – PPO | Admitting: Internal Medicine

## 2010-09-20 ENCOUNTER — Telehealth: Payer: Self-pay | Admitting: Internal Medicine

## 2010-09-20 ENCOUNTER — Encounter: Payer: Self-pay | Admitting: Internal Medicine

## 2010-09-20 ENCOUNTER — Ambulatory Visit (INDEPENDENT_AMBULATORY_CARE_PROVIDER_SITE_OTHER): Payer: Medicare Other | Admitting: Internal Medicine

## 2010-09-20 ENCOUNTER — Ambulatory Visit: Payer: BC Managed Care – PPO | Admitting: Internal Medicine

## 2010-09-20 DIAGNOSIS — F17200 Nicotine dependence, unspecified, uncomplicated: Secondary | ICD-10-CM

## 2010-09-20 DIAGNOSIS — E785 Hyperlipidemia, unspecified: Secondary | ICD-10-CM

## 2010-09-20 DIAGNOSIS — I2510 Atherosclerotic heart disease of native coronary artery without angina pectoris: Secondary | ICD-10-CM

## 2010-09-20 DIAGNOSIS — I10 Essential (primary) hypertension: Secondary | ICD-10-CM

## 2010-09-20 DIAGNOSIS — E119 Type 2 diabetes mellitus without complications: Secondary | ICD-10-CM

## 2010-09-20 MED ORDER — METFORMIN HCL 1000 MG PO TABS: 1000.0000 mg | ORAL_TABLET | Freq: Two times a day (BID) | ORAL | Status: DC

## 2010-09-20 MED ORDER — SIMVASTATIN 80 MG PO TABS: 80.0000 mg | ORAL_TABLET | Freq: Every day | ORAL | Status: DC

## 2010-09-20 MED ORDER — GLIMEPIRIDE 2 MG PO TABS: 2.0000 mg | ORAL_TABLET | ORAL | Status: DC

## 2010-09-20 MED ORDER — METOPROLOL TARTRATE 50 MG PO TABS: 50.0000 mg | ORAL_TABLET | Freq: Two times a day (BID) | ORAL | Status: DC

## 2010-09-20 MED ORDER — ENALAPRIL MALEATE 20 MG PO TABS: 20.0000 mg | ORAL_TABLET | Freq: Two times a day (BID) | ORAL | Status: DC

## 2010-09-20 NOTE — Patient Instructions (Signed)
Please check your hemoglobin A1c every 3 months    It is important that you exercise regularly, at least 20 minutes 3 to 4 times per week.  If you develop chest pain or shortness of breath seek  medical attention.  Limit your sodium (Salt) intake  You need to lose weight.  Consider a lower calorie diet and regular exercise.  Smoking tobacco is very bad for your health. You should stop smoking immediately.

## 2010-09-20 NOTE — Telephone Encounter (Signed)
Pt just saw Dr Amador Cunas this morning and he wanted him to follow up with Dr Cato Mulligan in 4 months. Patient stated that Dr Cato Mulligan normally orders labs prior. What labs are ok to schedule prior? Thanks.

## 2010-09-20 NOTE — Progress Notes (Signed)
  Subjective:    Patient ID: Benjamin Browning, male    DOB: [birth year], 65 y.o.   MRN: [id number]  HPI  and 65 year old patient who is seen today for his quarterly followup. He has coronary artery disease which has been stable. Denies any exertional symptoms. Unfortunately he continues to smoke cigars. He has treated hypertension dyslipidemia and type 2 diabetes. Hemoglobin A1c recently 6.8. Fasting blood sugar today 183 no concerns or complaints. He regards medication refills. A complete laboratory screen was done last month. Total cholesterol 150. He has had an eye exam in February of this year    Review of Systems  Constitutional: Negative for fever, chills, appetite change and fatigue.  HENT: Negative for hearing loss, ear pain, congestion, sore throat, trouble swallowing, neck stiffness, dental problem, voice change and tinnitus.   Eyes: Negative for pain, discharge and visual disturbance.  Respiratory: Negative for cough, chest tightness, wheezing and stridor.   Cardiovascular: Negative for chest pain, palpitations and leg swelling.  Gastrointestinal: Negative for nausea, vomiting, abdominal pain, diarrhea, constipation, blood in stool and abdominal distention.  Genitourinary: Negative for urgency, hematuria, flank pain, discharge, difficulty urinating and genital sores.  Musculoskeletal: Negative for myalgias, back pain, joint swelling, arthralgias and gait problem.  Skin: Negative for rash.  Neurological: Negative for dizziness, syncope, speech difficulty, weakness, numbness and headaches.  Hematological: Negative for adenopathy. Does not bruise/bleed easily.  Psychiatric/Behavioral: Negative for behavioral problems and dysphoric mood. The patient is not nervous/anxious.        Objective:   Physical Exam  Constitutional: He is oriented to person, place, and time. He appears well-developed.  HENT:  Head: Normocephalic.  Right Ear: External ear normal.  Left Ear: External ear normal.    Eyes: Conjunctivae and EOM are normal.  Neck: Normal range of motion.  Cardiovascular: Normal rate and normal heart sounds.   Pulmonary/Chest: Breath sounds normal.  Abdominal: Bowel sounds are normal.  Musculoskeletal: Normal range of motion. He exhibits no edema and no tenderness.  Neurological: He is alert and oriented to person, place, and time.  Psychiatric: He has a normal mood and affect. His behavior is normal.          Assessment & Plan:   Diabetes. Well controlled we'll continue present regimen. Weight loss or exercise I'll encourage Tobacco abuse. Total cessation encouraged Hypertension well controlled Dyslipidemia. Stable we'll continue simvastatin   Recheck 4 months

## 2010-09-21 NOTE — Telephone Encounter (Signed)
Bmet, a1c, lfts, lipids

## 2010-09-21 NOTE — Telephone Encounter (Signed)
lmoam to return my call. °

## 2010-11-17 ENCOUNTER — Telehealth: Payer: Self-pay | Admitting: Internal Medicine

## 2010-11-17 NOTE — Telephone Encounter (Signed)
Pt is coming in November for a 4 month follow up and wants to know if he should come in a week before for lab work. Please advise

## 2010-12-08 ENCOUNTER — Telehealth: Payer: Self-pay | Admitting: Internal Medicine

## 2010-12-08 NOTE — Telephone Encounter (Signed)
Pt called again regarding his up coming appt he wants to have labs done please advise so i can schedule. Thanks!

## 2010-12-08 NOTE — Telephone Encounter (Signed)
Lipid, liver, bmet, cbc, tsh, and HgBa1c

## 2010-12-10 NOTE — Telephone Encounter (Signed)
Pt returned called and has been sch for labs as noted below on 01/11/11 at 10:30 per pt req.

## 2011-01-10 ENCOUNTER — Other Ambulatory Visit (INDEPENDENT_AMBULATORY_CARE_PROVIDER_SITE_OTHER): Payer: Medicare Other

## 2011-01-10 DIAGNOSIS — E039 Hypothyroidism, unspecified: Secondary | ICD-10-CM

## 2011-01-10 DIAGNOSIS — E785 Hyperlipidemia, unspecified: Secondary | ICD-10-CM

## 2011-01-10 DIAGNOSIS — D649 Anemia, unspecified: Secondary | ICD-10-CM

## 2011-01-10 LAB — TSH: TSH: 1.23 u[IU]/mL (ref 0.35–5.50)

## 2011-01-10 LAB — CBC WITH DIFFERENTIAL/PLATELET
Basophils Relative: 0.4 % (ref 0.0–3.0)
Eosinophils Relative: 0.8 % (ref 0.0–5.0)
Hemoglobin: 15.6 g/dL (ref 13.0–17.0)
Lymphocytes Relative: 34.9 % (ref 12.0–46.0)
MCHC: 32.8 g/dL (ref 30.0–36.0)
MCV: 92 fl (ref 78.0–100.0)
Neutro Abs: 6.5 10*3/uL (ref 1.4–7.7)
Neutrophils Relative %: 56.7 % (ref 43.0–77.0)
RBC: 5.16 Mil/uL (ref 4.22–5.81)
WBC: 11.4 10*3/uL — ABNORMAL HIGH (ref 4.5–10.5)

## 2011-01-10 LAB — BASIC METABOLIC PANEL
CO2: 24 mEq/L (ref 19–32)
Calcium: 10 mg/dL (ref 8.4–10.5)
Chloride: 105 mEq/L (ref 96–112)
Creatinine, Ser: 1.3 mg/dL (ref 0.4–1.5)
Sodium: 142 mEq/L (ref 135–145)

## 2011-01-10 LAB — HEPATIC FUNCTION PANEL
ALT: 32 U/L (ref 0–53)
Albumin: 4.7 g/dL (ref 3.5–5.2)
Alkaline Phosphatase: 50 U/L (ref 39–117)
Bilirubin, Direct: 0 mg/dL (ref 0.0–0.3)
Total Protein: 8.2 g/dL (ref 6.0–8.3)

## 2011-01-10 LAB — LIPID PANEL: Total CHOL/HDL Ratio: 3

## 2011-01-10 LAB — LDL CHOLESTEROL, DIRECT: Direct LDL: 69.2 mg/dL

## 2011-01-11 ENCOUNTER — Ambulatory Visit: Payer: BC Managed Care – PPO | Admitting: Cardiology

## 2011-01-11 ENCOUNTER — Other Ambulatory Visit: Payer: BC Managed Care – PPO

## 2011-01-17 ENCOUNTER — Encounter: Payer: Self-pay | Admitting: Internal Medicine

## 2011-01-17 ENCOUNTER — Ambulatory Visit (INDEPENDENT_AMBULATORY_CARE_PROVIDER_SITE_OTHER): Payer: Medicare Other | Admitting: Internal Medicine

## 2011-01-17 VITALS — BP 142/84 | HR 76 | Temp 98.0°F | Ht 67.0 in | Wt 181.0 lb

## 2011-01-17 DIAGNOSIS — I10 Essential (primary) hypertension: Secondary | ICD-10-CM

## 2011-01-17 DIAGNOSIS — Z23 Encounter for immunization: Secondary | ICD-10-CM

## 2011-01-17 DIAGNOSIS — I2510 Atherosclerotic heart disease of native coronary artery without angina pectoris: Secondary | ICD-10-CM

## 2011-01-17 DIAGNOSIS — E119 Type 2 diabetes mellitus without complications: Secondary | ICD-10-CM

## 2011-01-17 NOTE — Assessment & Plan Note (Signed)
Patient has no sxs Continue risk factor modification  He will stop simvastatin for 6 weeks to see if knee pain is related. He will resume simvastatin after 6 weeks.

## 2011-01-17 NOTE — Assessment & Plan Note (Signed)
Reasonable control He will monitor at home Goal <130/80

## 2011-01-17 NOTE — Assessment & Plan Note (Signed)
Controlled Continue same meds Lab Results  Component Value Date   HGBA1C 6.9* 01/10/2011

## 2011-01-17 NOTE — Progress Notes (Signed)
patient comes in for followup of multiple medical problems including type 2 diabetes, hyperlipidemia, hypertension. The patient does not check blood sugar or blood pressure at home. The patetient does not follow an exercise or diet program. The patient denies any polyuria, polydipsia.  In the past the patient has gone to diabetic treatment center. The patient is tolerating medications  Without difficulty. The patient does admit to medication compliance.  Has some knee pain with prolonged walking/standing  Past Medical History  Diagnosis Date  . CAD (coronary artery disease)   . Heart attack 2005    w/ Taxus stenting to a native vessel   . Diabetes mellitus 2005    type 2  . Hyperlipidemia   . Heart attack 1991  . Cerebrovascular accident 4/01  . Positive PPD     History   Social History  . Marital Status: Married    Spouse Name: N/A    Number of Children: N/A  . Years of Education: N/A   Occupational History  . Not on file.   Social History Main Topics  . Smoking status: Current Everyday Smoker    Types: Cigars  . Smokeless tobacco: Not on file   Comment: 2 cigars per day  . Alcohol Use: Not on file  . Drug Use: Not on file  . Sexually Active: Not on file   Other Topics Concern  . Not on file   Social History Narrative  . No narrative on file    Past Surgical History  Procedure Date  . Coronary artery bypass graft 1991  . Appendectomy     as child  . Inguinal hernia repair 1985    No family history on file.  No Known Allergies  Current Outpatient Prescriptions on File Prior to Visit  Medication Sig Dispense Refill  . aspirin 325 MG EC tablet Take 325 mg by mouth 2 (two) times daily.        . enalapril (VASOTEC) 20 MG tablet Take 1 tablet (20 mg total) by mouth 2 (two) times daily.  90 tablet  6  . glimepiride (AMARYL) 2 MG tablet Take 1 tablet (2 mg total) by mouth every morning.  90 tablet  3  . metFORMIN (GLUCOPHAGE) 1000 MG tablet Take 1 tablet (1,000  mg total) by mouth 2 (two) times daily.  180 tablet  6  . metoprolol (LOPRESSOR) 50 MG tablet Take 1 tablet (50 mg total) by mouth 2 (two) times daily.  180 tablet  6  . simvastatin (ZOCOR) 80 MG tablet Take 1 tablet (80 mg total) by mouth at bedtime.  90 tablet  6     patient denies chest pain, shortness of breath, orthopnea. Denies lower extremity edema, abdominal pain, change in appetite, change in bowel movements. Patient denies rashes, musculoskeletal complaints. No other specific complaints in a complete review of systems.   BP 142/84  Pulse 76  Temp(Src) 98 F (36.7 C) (Oral)  Ht 5\' 7"  (1.702 m)  Wt 181 lb (82.101 kg)  BMI 28.35 kg/m2  well-developed well-nourished male in no acute distress. HEENT exam atraumatic, normocephalic, neck supple without jugular venous distention. Chest clear to auscultation cardiac exam S1-S2 are regular. Abdominal exam overweight with bowel sounds, soft and nontender. Extremities no edema. Neurologic exam is alert with a normal gait.

## 2011-03-15 ENCOUNTER — Ambulatory Visit (INDEPENDENT_AMBULATORY_CARE_PROVIDER_SITE_OTHER): Payer: Medicare Other | Admitting: Cardiology

## 2011-03-15 ENCOUNTER — Encounter: Payer: Self-pay | Admitting: Cardiology

## 2011-03-15 DIAGNOSIS — I2510 Atherosclerotic heart disease of native coronary artery without angina pectoris: Secondary | ICD-10-CM | POA: Diagnosis not present

## 2011-03-15 DIAGNOSIS — E785 Hyperlipidemia, unspecified: Secondary | ICD-10-CM

## 2011-03-15 DIAGNOSIS — F17200 Nicotine dependence, unspecified, uncomplicated: Secondary | ICD-10-CM

## 2011-03-15 DIAGNOSIS — I10 Essential (primary) hypertension: Secondary | ICD-10-CM

## 2011-03-15 NOTE — Patient Instructions (Signed)
Your physician has requested that you have an exercise tolerance test. For further information please visit [email or web address]. Please also follow instruction sheet, as given.  The current medical regimen is effective;  continue present plan and medications.  

## 2011-03-15 NOTE — Progress Notes (Signed)
   HPI The patient presents for yearly follow up.  Since I last saw him he has done well.  The patient denies any new symptoms such as chest discomfort, neck or arm discomfort. There has been no new shortness of breath, PND or orthopnea. There have been no reported palpitations, presyncope or syncope.  He has not been exercising but he has been active.  He continues to smoke cigars.  No Known Allergies  Current Outpatient Prescriptions  Medication Sig Dispense Refill  . aspirin 325 MG EC tablet Take 325 mg by mouth 2 (two) times daily.        . enalapril (VASOTEC) 20 MG tablet Take 1 tablet (20 mg total) by mouth 2 (two) times daily.  90 tablet  6  . glimepiride (AMARYL) 2 MG tablet Take 1 tablet (2 mg total) by mouth every morning.  90 tablet  3  . metFORMIN (GLUCOPHAGE) 1000 MG tablet Take 1 tablet (1,000 mg total) by mouth 2 (two) times daily.  180 tablet  6  . metoprolol (LOPRESSOR) 50 MG tablet Take 1 tablet (50 mg total) by mouth 2 (two) times daily.  180 tablet  6  . simvastatin (ZOCOR) 80 MG tablet Take 1 tablet (80 mg total) by mouth at bedtime.  90 tablet  6    Past Medical History  Diagnosis Date  . CAD (coronary artery disease)   . Heart attack 2005    w/ Taxus stenting to a native vessel   . Diabetes mellitus 2005    type 2  . Hyperlipidemia   . Heart attack 1991  . Cerebrovascular accident 4/01  . Positive PPD     Past Surgical History  Procedure Date  . Coronary artery bypass graft 1991  . Appendectomy     as child  . Inguinal hernia repair 1985    ROS: As stated in the HPI and negative for all other systems.  PHYSICAL EXAM BP 150/80  Pulse 66  Ht 5\' 7"  (1.702 m)  Wt 180 lb (81.647 kg)  BMI 28.19 kg/m2 GENERAL:  Well appearing HEENT:  Pupils equal round and reactive, fundi not visualized, oral mucosa unremarkable NECK:  No jugular venous distention, waveform within normal limits, carotid upstroke brisk and symmetric, no bruits, no thyromegaly LYMPHATICS:   No cervical, inguinal adenopathy LUNGS:  Clear to auscultation bilaterally BACK:  No CVA tenderness CHEST:  Well healed sternotomy scar. HEART:  PMI not displaced or sustained,S1 and S2 within normal limits, no S3, no S4, no clicks, no rubs, no murmurs ABD:  Flat, positive bowel sounds normal in frequency in pitch, no bruits, no rebound, no guarding, no midline pulsatile mass, no hepatomegaly, no splenomegaly EXT:  2 plus pulses throughout, no edema, no cyanosis no clubbing SKIN:  No rashes no nodules NEURO:  Cranial nerves II through XII grossly intact, motor grossly intact throughout PSYCH:  Cognitively intact, oriented to person place and time  EKG:  Sinus rhythm, rate 66, axis within normal limits, intervals within normal limits, no acute ST-T wave changes.  ASSESSMENT AND PLAN

## 2011-03-15 NOTE — Assessment & Plan Note (Signed)
I will bring the patient back for a POET (Plain Old Exercise Test). This will allow me to screen for obstructive coronary disease, risk stratify and very importantly provide a prescription for exercise.   

## 2011-03-15 NOTE — Assessment & Plan Note (Signed)
I reviewed his lipids and he is at target though the triglycerides are elevated.

## 2011-03-15 NOTE — Assessment & Plan Note (Signed)
I warned against all tobacco including cigars.

## 2011-03-15 NOTE — Assessment & Plan Note (Signed)
The blood pressure continues to be high. I have instructed the patient to record a blood pressure diary and recording this. This will be presented for my review and pending these results I will make further suggestions about changes in therapy for optimal blood pressure control.  

## 2011-04-06 ENCOUNTER — Encounter: Payer: BC Managed Care – PPO | Admitting: Cardiology

## 2011-04-13 DIAGNOSIS — H2510 Age-related nuclear cataract, unspecified eye: Secondary | ICD-10-CM | POA: Diagnosis not present

## 2011-06-29 ENCOUNTER — Other Ambulatory Visit (INDEPENDENT_AMBULATORY_CARE_PROVIDER_SITE_OTHER): Payer: Medicare Other

## 2011-06-29 DIAGNOSIS — E785 Hyperlipidemia, unspecified: Secondary | ICD-10-CM | POA: Diagnosis not present

## 2011-06-29 DIAGNOSIS — I10 Essential (primary) hypertension: Secondary | ICD-10-CM

## 2011-06-29 DIAGNOSIS — D649 Anemia, unspecified: Secondary | ICD-10-CM | POA: Diagnosis not present

## 2011-06-29 LAB — CBC WITH DIFFERENTIAL/PLATELET
Basophils Absolute: 0 10*3/uL (ref 0.0–0.1)
Basophils Relative: 0.4 % (ref 0.0–3.0)
Eosinophils Absolute: 0.1 10*3/uL (ref 0.0–0.7)
MCHC: 33.9 g/dL (ref 30.0–36.0)
MCV: 89.9 fl (ref 78.0–100.0)
Monocytes Absolute: 0.7 10*3/uL (ref 0.1–1.0)
Neutro Abs: 6.2 10*3/uL (ref 1.4–7.7)
Neutrophils Relative %: 62.2 % (ref 43.0–77.0)
RBC: 4.72 Mil/uL (ref 4.22–5.81)
RDW: 12.9 % (ref 11.5–14.6)

## 2011-06-29 LAB — HEPATIC FUNCTION PANEL
Albumin: 4.4 g/dL (ref 3.5–5.2)
Bilirubin, Direct: 0 mg/dL (ref 0.0–0.3)
Total Protein: 7.8 g/dL (ref 6.0–8.3)

## 2011-06-29 LAB — BASIC METABOLIC PANEL
CO2: 23 mEq/L (ref 19–32)
Chloride: 104 mEq/L (ref 96–112)
Creatinine, Ser: 1.1 mg/dL (ref 0.4–1.5)
Glucose, Bld: 122 mg/dL — ABNORMAL HIGH (ref 70–99)

## 2011-06-29 LAB — LIPID PANEL
Cholesterol: 111 mg/dL (ref 0–200)
HDL: 40.7 mg/dL (ref >39.00)
Triglycerides: 198 mg/dL — ABNORMAL HIGH (ref 0.0–149.0)

## 2011-06-29 NOTE — Progress Notes (Signed)
Addended by: Rita Ohara R on: 06/29/2011 05:15 PM   Modules accepted: Orders

## 2011-06-30 NOTE — Progress Notes (Signed)
Addended by: Rita Ohara R on: 06/30/2011 03:32 PM   Modules accepted: Orders

## 2011-07-06 ENCOUNTER — Ambulatory Visit (INDEPENDENT_AMBULATORY_CARE_PROVIDER_SITE_OTHER): Payer: Medicare Other | Admitting: Internal Medicine

## 2011-07-06 VITALS — BP 148/90 | HR 64 | Temp 97.8°F | Wt 180.0 lb

## 2011-07-06 DIAGNOSIS — I2510 Atherosclerotic heart disease of native coronary artery without angina pectoris: Secondary | ICD-10-CM | POA: Diagnosis not present

## 2011-07-06 DIAGNOSIS — I10 Essential (primary) hypertension: Secondary | ICD-10-CM

## 2011-07-06 DIAGNOSIS — E119 Type 2 diabetes mellitus without complications: Secondary | ICD-10-CM | POA: Diagnosis not present

## 2011-07-06 MED ORDER — ATORVASTATIN CALCIUM 20 MG PO TABS: 20.0000 mg | ORAL_TABLET | Freq: Every day | ORAL | Status: DC

## 2011-07-06 NOTE — Progress Notes (Signed)
Cad-- no sxs tolerating meds Bladder ca-- has been approx 10 years  DM2-- needs f/u, on metformin   patient comes in for followup of multiple medical problems including type 2 diabetes, hyperlipidemia, hypertension. The patient does not check blood sugar or blood pressure at home. The patetient does not follow an exercise or diet program. The patient denies any polyuria, polydipsia.  In the past the patient has gone to diabetic treatment center. The patient is tolerating medications  Without difficulty. The patient does admit to medication compliance.  Past Medical History  Diagnosis Date  . CAD (coronary artery disease)   . Heart attack 2005    w/ Taxus stenting to a native vessel   . Diabetes mellitus 2005    type 2  . Hyperlipidemia   . Heart attack 1991  . Cerebrovascular accident 4/01  . Positive PPD     History   Social History  . Marital Status: Married    Spouse Name: N/A    Number of Children: N/A  . Years of Education: N/A   Occupational History  . Not on file.   Social History Main Topics  . Smoking status: Current Everyday Smoker    Types: Cigars  . Smokeless tobacco: Not on file   Comment: 2 cigars per day  . Alcohol Use: Not on file  . Drug Use: Not on file  . Sexually Active: Not on file   Other Topics Concern  . Not on file   Social History Narrative  . No narrative on file    Past Surgical History  Procedure Date  . Coronary artery bypass graft 1991  . Appendectomy     as child  . Inguinal hernia repair 1985    No family history on file.  No Known Allergies  Current Outpatient Prescriptions on File Prior to Visit  Medication Sig Dispense Refill  . aspirin 325 MG EC tablet Take 325 mg by mouth 2 (two) times daily.        . enalapril (VASOTEC) 20 MG tablet Take 1 tablet (20 mg total) by mouth 2 (two) times daily.  90 tablet  6  . glimepiride (AMARYL) 2 MG tablet Take 1 tablet (2 mg total) by mouth every morning.  90 tablet  3  . metFORMIN  (GLUCOPHAGE) 1000 MG tablet Take 1 tablet (1,000 mg total) by mouth 2 (two) times daily.  180 tablet  6  . metoprolol (LOPRESSOR) 50 MG tablet Take 1 tablet (50 mg total) by mouth 2 (two) times daily.  180 tablet  6  . atorvastatin (LIPITOR) 20 MG tablet Take 1 tablet (20 mg total) by mouth daily.  90 tablet  3     patient denies chest pain, shortness of breath, orthopnea. Denies lower extremity edema, abdominal pain, change in appetite, change in bowel movements. Patient denies rashes, musculoskeletal complaints. No other specific complaints in a complete review of systems.   BP 148/90  Pulse 64  Temp(Src) 97.8 F (36.6 C) (Oral)  Wt 180 lb (81.647 kg)  well-developed well-nourished male in no acute distress. HEENT exam atraumatic, normocephalic, neck supple without jugular venous distention. Chest clear to auscultation cardiac exam S1-S2 are regular. Abdominal exam overweight with bowel sounds, soft and nontender. Extremities no edema. Neurologic exam is alert with a normal gait.

## 2011-07-07 NOTE — Assessment & Plan Note (Signed)
Lab Results  Component Value Date   HGBA1C 6.7* 06/29/2011   Controlled Check labs

## 2011-07-07 NOTE — Assessment & Plan Note (Signed)
BP Readings from Last 3 Encounters:  07/06/11 148/90  03/15/11 150/80  01/17/11 142/84   i have asked him to monitor bp at home Goal bp < 130/80

## 2011-07-07 NOTE — Assessment & Plan Note (Signed)
No sxs, continue current meds

## 2011-09-26 ENCOUNTER — Other Ambulatory Visit: Payer: Self-pay | Admitting: Internal Medicine

## 2011-09-26 NOTE — Telephone Encounter (Signed)
Dr. Swords pt 

## 2011-10-17 ENCOUNTER — Telehealth: Payer: Self-pay | Admitting: Internal Medicine

## 2011-10-17 MED ORDER — ENALAPRIL MALEATE 20 MG PO TABS: 20.0000 mg | ORAL_TABLET | Freq: Two times a day (BID) | ORAL | Status: DC

## 2011-10-17 NOTE — Telephone Encounter (Signed)
Pt called and has questions re: enalapril (VASOTEC) 20 MG tablet. Pt said that [facility] will not fill med because according to pharmacist, the script says for pt to take 1 pill, once a day, but pt says that its 1 pill twice a day.

## 2011-10-17 NOTE — Telephone Encounter (Signed)
rx sent in electronically 

## 2011-10-18 ENCOUNTER — Telehealth: Payer: Self-pay | Admitting: Internal Medicine

## 2011-10-18 NOTE — Telephone Encounter (Signed)
I called [facility] and they states it was filled yesterday 8-26 and shipped- pt informed

## 2011-10-18 NOTE — Telephone Encounter (Signed)
Pt called and said that [facility] will not fill pts enalapril (VASOTEC) 20 MG tablet until 11/03/11, and pt does not understand why. Pt req that nurse calls [facility] and auth them to go ahead and fill med now, because pt is almost out of med, or [facility] [place] [phone] and let pt get a partial refill to last until 11/03/11.

## 2011-10-23 LAB — HM DIABETES EYE EXAM

## 2011-12-27 ENCOUNTER — Other Ambulatory Visit (INDEPENDENT_AMBULATORY_CARE_PROVIDER_SITE_OTHER): Payer: Medicare Other

## 2011-12-27 DIAGNOSIS — E119 Type 2 diabetes mellitus without complications: Secondary | ICD-10-CM

## 2011-12-27 LAB — HEMOGLOBIN A1C: Hgb A1c MFr Bld: 7.1 % — ABNORMAL HIGH (ref 4.6–6.5)

## 2011-12-27 LAB — BASIC METABOLIC PANEL
BUN: 14 mg/dL (ref 6–23)
Chloride: 102 mEq/L (ref 96–112)
Potassium: 4.2 mEq/L (ref 3.5–5.1)
Sodium: 135 mEq/L (ref 135–145)

## 2011-12-27 LAB — LIPID PANEL
Cholesterol: 149 mg/dL (ref 0–200)
HDL: 36.3 mg/dL — ABNORMAL LOW (ref >39.00)
Triglycerides: 260 mg/dL — ABNORMAL HIGH (ref 0.0–149.0)
VLDL: 52 mg/dL — ABNORMAL HIGH (ref 0.0–40.0)

## 2011-12-27 LAB — HEPATIC FUNCTION PANEL
Albumin: 4.2 g/dL (ref 3.5–5.2)
Total Protein: 7.6 g/dL (ref 6.0–8.3)

## 2012-01-03 ENCOUNTER — Ambulatory Visit (INDEPENDENT_AMBULATORY_CARE_PROVIDER_SITE_OTHER): Payer: Medicare Other | Admitting: Internal Medicine

## 2012-01-03 ENCOUNTER — Encounter: Payer: Self-pay | Admitting: Internal Medicine

## 2012-01-03 VITALS — BP 140/90 | HR 78 | Resp 16

## 2012-01-03 DIAGNOSIS — E1159 Type 2 diabetes mellitus with other circulatory complications: Secondary | ICD-10-CM | POA: Diagnosis not present

## 2012-01-03 DIAGNOSIS — I119 Hypertensive heart disease without heart failure: Secondary | ICD-10-CM

## 2012-01-03 DIAGNOSIS — E785 Hyperlipidemia, unspecified: Secondary | ICD-10-CM | POA: Diagnosis not present

## 2012-01-03 DIAGNOSIS — E119 Type 2 diabetes mellitus without complications: Secondary | ICD-10-CM

## 2012-01-03 DIAGNOSIS — M25569 Pain in unspecified knee: Secondary | ICD-10-CM

## 2012-01-03 DIAGNOSIS — I798 Other disorders of arteries, arterioles and capillaries in diseases classified elsewhere: Secondary | ICD-10-CM

## 2012-01-03 DIAGNOSIS — I2510 Atherosclerotic heart disease of native coronary artery without angina pectoris: Secondary | ICD-10-CM | POA: Diagnosis not present

## 2012-01-03 DIAGNOSIS — F17200 Nicotine dependence, unspecified, uncomplicated: Secondary | ICD-10-CM

## 2012-01-03 DIAGNOSIS — C679 Malignant neoplasm of bladder, unspecified: Secondary | ICD-10-CM

## 2012-01-03 DIAGNOSIS — E1151 Type 2 diabetes mellitus with diabetic peripheral angiopathy without gangrene: Secondary | ICD-10-CM

## 2012-01-03 LAB — HM DIABETES FOOT EXAM

## 2012-01-03 NOTE — Assessment & Plan Note (Signed)
bp fairly well cointrolled He should address with weight loss and exercise

## 2012-01-03 NOTE — Assessment & Plan Note (Signed)
He has multiple complications including vascular dzs. A1C is ok Continue risk factor modification

## 2012-01-03 NOTE — Assessment & Plan Note (Signed)
No sxs Continue risk factor modification 

## 2012-01-03 NOTE — Progress Notes (Signed)
Patient ID: Benjamin Browning, male   DOB: Feb 04, 1946, 66 y.o.   MRN: [id number]  patient comes in for followup of multiple medical problems including type 2 diabetes, hyperlipidemia, hypertension. The patient does not check blood sugar or blood pressure at home. The patetient does not follow an exercise or diet program. The patient denies any polyuria, polydipsia.  In the past the patient has gone to diabetic treatment center. The patient is tolerating medications  Without difficulty. The patient does admit to medication compliance.   Past Medical History  Diagnosis Date  . CAD (coronary artery disease)   . Heart attack 2005    w/ Taxus stenting to a native vessel   . Diabetes mellitus 2005    type 2  . Hyperlipidemia   . Heart attack 1991  . Cerebrovascular accident 4/01  . Positive PPD     History   Social History  . Marital Status: Married    Spouse Name: N/A    Number of Children: N/A  . Years of Education: N/A   Occupational History  . Not on file.   Social History Main Topics  . Smoking status: Current Every Day Smoker    Types: Cigars  . Smokeless tobacco: Not on file     Comment: 2 cigars per day  . Alcohol Use: Not on file  . Drug Use: Not on file  . Sexually Active: Not on file   Other Topics Concern  . Not on file   Social History Narrative  . No narrative on file    Past Surgical History  Procedure Date  . Coronary artery bypass graft 1991  . Appendectomy     as child  . Inguinal hernia repair 1985    No family history on file.  No Known Allergies  Current Outpatient Prescriptions on File Prior to Visit  Medication Sig Dispense Refill  . aspirin 325 MG EC tablet Take 325 mg by mouth 2 (two) times daily.        Marland Kitchen atorvastatin (LIPITOR) 20 MG tablet Take 1 tablet (20 mg total) by mouth daily.  90 tablet  3  . enalapril (VASOTEC) 20 MG tablet Take 1 tablet (20 mg total) by mouth 2 (two) times daily.  180 tablet  3  . glimepiride (AMARYL) 2 MG tablet  TAKE 1 TABLET EVERY MORNING  90 tablet  1  . metFORMIN (GLUCOPHAGE) 1000 MG tablet TAKE 1 TABLET TWICE A DAY  180 tablet  1  . metoprolol (LOPRESSOR) 50 MG tablet TAKE 1 TABLET TWICE A DAY  180 tablet  1     patient denies chest pain, shortness of breath, orthopnea. Denies lower extremity edema, abdominal pain, change in appetite, change in bowel movements. Patient denies rashes, musculoskeletal complaints. No other specific complaints in a complete review of systems.   BP 140/90  Pulse 78  Resp 16  well-developed well-nourished male in no acute distress. HEENT exam atraumatic, normocephalic, neck supple without jugular venous distention. Chest clear to auscultation cardiac exam S1-S2 are regular. Abdominal exam overweight with bowel sounds, soft and nontender. Extremities no edema. Neurologic exam is alert with a normal gait.

## 2012-01-03 NOTE — Assessment & Plan Note (Signed)
Fair control Continue meds 

## 2012-01-03 NOTE — Assessment & Plan Note (Signed)
Discussed need for absolute cessation Stop al tobacco products

## 2012-01-03 NOTE — Assessment & Plan Note (Signed)
No know recurrence

## 2012-01-09 ENCOUNTER — Ambulatory Visit (INDEPENDENT_AMBULATORY_CARE_PROVIDER_SITE_OTHER)
Admission: RE | Admit: 2012-01-09 | Discharge: 2012-01-09 | Disposition: A | Discharge status: Home / Self Care | Payer: Medicare Other | Source: Ambulatory Visit | Attending: Internal Medicine | Admitting: Internal Medicine

## 2012-01-09 DIAGNOSIS — M25569 Pain in unspecified knee: Secondary | ICD-10-CM

## 2012-01-11 NOTE — Progress Notes (Signed)
Left a message for pt to return call 

## 2012-02-27 ENCOUNTER — Other Ambulatory Visit: Payer: Self-pay | Admitting: Internal Medicine

## 2012-02-28 ENCOUNTER — Other Ambulatory Visit: Payer: Self-pay | Admitting: Internal Medicine

## 2012-04-17 ENCOUNTER — Encounter: Payer: Self-pay | Admitting: Internal Medicine

## 2012-04-17 ENCOUNTER — Ambulatory Visit (INDEPENDENT_AMBULATORY_CARE_PROVIDER_SITE_OTHER): Payer: Medicare Other | Admitting: Internal Medicine

## 2012-04-17 VITALS — BP 164/100 | HR 75 | Temp 97.6°F | Resp 20 | Wt 183.0 lb

## 2012-04-17 DIAGNOSIS — M545 Low back pain: Secondary | ICD-10-CM | POA: Diagnosis not present

## 2012-04-17 DIAGNOSIS — I798 Other disorders of arteries, arterioles and capillaries in diseases classified elsewhere: Secondary | ICD-10-CM | POA: Diagnosis not present

## 2012-04-17 DIAGNOSIS — M25561 Pain in right knee: Secondary | ICD-10-CM

## 2012-04-17 DIAGNOSIS — M25569 Pain in unspecified knee: Secondary | ICD-10-CM

## 2012-04-17 DIAGNOSIS — E1151 Type 2 diabetes mellitus with diabetic peripheral angiopathy without gangrene: Secondary | ICD-10-CM

## 2012-04-17 DIAGNOSIS — E1159 Type 2 diabetes mellitus with other circulatory complications: Secondary | ICD-10-CM

## 2012-04-17 DIAGNOSIS — I2510 Atherosclerotic heart disease of native coronary artery without angina pectoris: Secondary | ICD-10-CM

## 2012-04-17 NOTE — Patient Instructions (Signed)
Take 650-100 0 mg of Tylenol every 6 hours as needed for pain relief or fever.  Avoid taking more than 3000 mg in a 24-hour period (  This may cause liver damage).  Call or return to clinic prn if these symptoms worsen or fail to improve as anticipated.   Office followup as scheduled in 2 months

## 2012-04-17 NOTE — Progress Notes (Signed)
Subjective:    Patient ID: Benjamin Browning, male    DOB: [birth year], 67 y.o.   MRN: [id number]  HPI  67 year old patient whose stable medical problems include diabetes coronary artery disease and ongoing tobacco use.  He presents today with the complaints of low back pain as well as worsening right knee pain he states this has been present for 2 months but his wife states that it has been going on longer. 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 This clearly aggravates his back and right knee discomfort.  Past Medical History  Diagnosis Date  . CAD (coronary artery disease)   . Heart attack 2005    w/ Taxus stenting to a native vessel   . Diabetes mellitus 2005    type 2  . Hyperlipidemia   . Heart attack 1991  . Cerebrovascular accident 4/01  . Positive PPD     History   Social History  . Marital Status: Married    Spouse Name: N/A    Number of Children: N/A  . Years of Education: N/A   Occupational History  . Not on file.   Social History Main Topics  . Smoking status: Current Every Day Smoker    Types: Cigars  . Smokeless tobacco: Not on file     Comment: 2 cigars per day  . Alcohol Use: Not on file  . Drug Use: Not on file  . Sexually Active: Not on file   Other Topics Concern  . Not on file   Social History Narrative  . No narrative on file    Past Surgical History  Procedure Laterality Date  . Coronary artery bypass graft  1991  . Appendectomy      as child  . Inguinal hernia repair  1985    No family history on file.  No Known Allergies  Current Outpatient Prescriptions on File Prior to Visit  Medication Sig Dispense Refill  . aspirin 325 MG EC tablet Take 325 mg by mouth 2 (two) times daily.        Marland Kitchen atorvastatin (LIPITOR) 20 MG tablet TAKE 1 TABLET DAILY  90 tablet  3  . enalapril (VASOTEC) 20 MG tablet Take 1 tablet (20 mg total) by mouth 2 (two) times daily.  180 tablet  3   . glimepiride (AMARYL) 2 MG tablet TAKE 1 TABLET EVERY MORNING  90 tablet  1  . metFORMIN (GLUCOPHAGE) 1000 MG tablet TAKE 1 TABLET TWICE A DAY  180 tablet  1  . metoprolol (LOPRESSOR) 50 MG tablet TAKE 1 TABLET TWICE A DAY  180 tablet  1   No current facility-administered medications on file prior to visit.    BP 164/100  Pulse 75  Temp(Src) 97.6 F (36.4 C) (Oral)  Resp 20  Wt 183 lb (83.008 kg)  BMI 28.66 kg/m2  SpO2 98%       Review of Systems  Constitutional: Negative for fever, chills, appetite change and fatigue.  HENT: Negative for hearing loss, ear pain, congestion, sore throat, trouble swallowing, neck stiffness, dental problem, voice change and tinnitus.   Eyes: Negative for pain, discharge and visual disturbance.  Respiratory: Negative for cough, chest tightness, wheezing and stridor.   Cardiovascular: Negative for chest pain, palpitations and leg swelling.  Gastrointestinal: Negative for nausea, vomiting, abdominal pain, diarrhea, constipation, blood in stool and abdominal distention.  Genitourinary: Negative for urgency, hematuria, flank pain, discharge,  difficulty urinating and genital sores.  Musculoskeletal: Positive for back pain and arthralgias. Negative for myalgias, joint swelling and gait problem.  Skin: Negative for rash.  Neurological: Negative for dizziness, syncope, speech difficulty, weakness, numbness and headaches.  Hematological: Negative for adenopathy. Does not bruise/bleed easily.  Psychiatric/Behavioral: Negative for behavioral problems and dysphoric mood. The patient is not nervous/anxious.        Objective:   Physical Exam  Constitutional: He appears well-developed and well-nourished. No distress.  Musculoskeletal:  Straight leg testing normal Hamstrings tight Right knee slightly warm to touch compared to the left but no obvious effusion          Assessment & Plan:   Low back pain and right knee pain probable osteoarthritis.   The patient has been asked to avoid anti-inflammatory medications do to possible procoagulant effect. Will try Tylenol when necessary and will moderate his physical activities. He does have an appointment in 2 months. We'll reassess at that time and consider further evaluation Diabetes mellitus Coronary artery disease

## 2012-05-03 DIAGNOSIS — E119 Type 2 diabetes mellitus without complications: Secondary | ICD-10-CM | POA: Diagnosis not present

## 2012-05-04 ENCOUNTER — Other Ambulatory Visit: Payer: Self-pay | Admitting: Internal Medicine

## 2012-06-06 ENCOUNTER — Other Ambulatory Visit: Payer: Self-pay | Admitting: Internal Medicine

## 2012-06-25 ENCOUNTER — Other Ambulatory Visit (INDEPENDENT_AMBULATORY_CARE_PROVIDER_SITE_OTHER): Payer: Medicare Other

## 2012-06-25 DIAGNOSIS — E119 Type 2 diabetes mellitus without complications: Secondary | ICD-10-CM | POA: Diagnosis not present

## 2012-06-25 DIAGNOSIS — E1151 Type 2 diabetes mellitus with diabetic peripheral angiopathy without gangrene: Secondary | ICD-10-CM

## 2012-06-25 DIAGNOSIS — E1159 Type 2 diabetes mellitus with other circulatory complications: Secondary | ICD-10-CM

## 2012-06-25 DIAGNOSIS — I798 Other disorders of arteries, arterioles and capillaries in diseases classified elsewhere: Secondary | ICD-10-CM

## 2012-06-25 LAB — HEMOGLOBIN A1C: Hgb A1c MFr Bld: 7.2 % — ABNORMAL HIGH (ref 4.6–6.5)

## 2012-06-25 LAB — BASIC METABOLIC PANEL
CO2: 23 mEq/L (ref 19–32)
Calcium: 9.1 mg/dL (ref 8.4–10.5)
Creatinine, Ser: 1.1 mg/dL (ref 0.4–1.5)
GFR: 71.52 mL/min (ref >60.00)
Sodium: 137 mEq/L (ref 135–145)

## 2012-06-25 LAB — HEPATIC FUNCTION PANEL
ALT: 21 U/L (ref 0–53)
AST: 18 U/L (ref 0–37)
Albumin: 4.2 g/dL (ref 3.5–5.2)
Alkaline Phosphatase: 61 U/L (ref 39–117)
Bilirubin, Direct: 0 mg/dL (ref 0.0–0.3)
Total Protein: 7.6 g/dL (ref 6.0–8.3)

## 2012-06-25 LAB — LIPID PANEL
Cholesterol: 119 mg/dL (ref 0–200)
LDL Cholesterol: 53 mg/dL (ref 0–99)
Triglycerides: 148 mg/dL (ref 0.0–149.0)

## 2012-07-02 ENCOUNTER — Ambulatory Visit (INDEPENDENT_AMBULATORY_CARE_PROVIDER_SITE_OTHER)
Admission: RE | Admit: 2012-07-02 | Discharge: 2012-07-02 | Disposition: A | Discharge status: Home / Self Care | Payer: Medicare Other | Source: Ambulatory Visit | Attending: Internal Medicine | Admitting: Internal Medicine

## 2012-07-02 ENCOUNTER — Encounter: Payer: Self-pay | Admitting: Internal Medicine

## 2012-07-02 ENCOUNTER — Ambulatory Visit (INDEPENDENT_AMBULATORY_CARE_PROVIDER_SITE_OTHER): Payer: Medicare Other | Admitting: Internal Medicine

## 2012-07-02 ENCOUNTER — Ambulatory Visit: Payer: Medicare Other | Admitting: Internal Medicine

## 2012-07-02 VITALS — BP 112/70 | HR 76 | Temp 98.2°F | Wt 177.0 lb

## 2012-07-02 DIAGNOSIS — M25569 Pain in unspecified knee: Secondary | ICD-10-CM | POA: Diagnosis not present

## 2012-07-02 DIAGNOSIS — C679 Malignant neoplasm of bladder, unspecified: Secondary | ICD-10-CM | POA: Diagnosis not present

## 2012-07-02 DIAGNOSIS — E1139 Type 2 diabetes mellitus with other diabetic ophthalmic complication: Secondary | ICD-10-CM

## 2012-07-02 DIAGNOSIS — E1159 Type 2 diabetes mellitus with other circulatory complications: Secondary | ICD-10-CM | POA: Diagnosis not present

## 2012-07-02 DIAGNOSIS — M25561 Pain in right knee: Secondary | ICD-10-CM

## 2012-07-02 DIAGNOSIS — E785 Hyperlipidemia, unspecified: Secondary | ICD-10-CM

## 2012-07-02 DIAGNOSIS — I798 Other disorders of arteries, arterioles and capillaries in diseases classified elsewhere: Secondary | ICD-10-CM | POA: Diagnosis not present

## 2012-07-02 DIAGNOSIS — E1151 Type 2 diabetes mellitus with diabetic peripheral angiopathy without gangrene: Secondary | ICD-10-CM

## 2012-07-02 NOTE — Assessment & Plan Note (Signed)
Well controlled Continue same meds 

## 2012-07-02 NOTE — Assessment & Plan Note (Signed)
Has regular f/u with urology No known recurrence

## 2012-07-02 NOTE — Assessment & Plan Note (Signed)
a1c is adequately controlled, continue same meds

## 2012-07-02 NOTE — Progress Notes (Signed)
Patient ID: Benjamin Browning, male   DOB: March 19, 1945, 67 y.o.   MRN: [id number]   patient comes in for followup of multiple medical problems including type 2 diabetes, hyperlipidemia, hypertension. The patient does not check blood sugar or blood pressure at home. The patetient does not follow an exercise or diet program. The patient denies any polyuria, polydipsia.  In the past the patient has gone to diabetic treatment center. The patient is tolerating medications  Without difficulty. The patient does admit to medication compliance.   CAD-- no sxs  Bladder ca-- sees urology once yearly  Right knee pain for 6 months  Past Medical History  Diagnosis Date  . CAD (coronary artery disease)   . Heart attack 2005    w/ Taxus stenting to a native vessel   . Diabetes mellitus 2005    type 2  . Hyperlipidemia   . Heart attack 1991  . Cerebrovascular accident 4/01  . Positive PPD     History   Social History  . Marital Status: Married    Spouse Name: N/A    Number of Children: N/A  . Years of Education: N/A   Occupational History  . Not on file.   Social History Main Topics  . Smoking status: Current Every Day Smoker    Types: Cigars  . Smokeless tobacco: Not on file     Comment: 2 cigars per day  . Alcohol Use: Not on file  . Drug Use: Not on file  . Sexually Active: Not on file   Other Topics Concern  . Not on file   Social History Narrative  . No narrative on file    Past Surgical History  Procedure Laterality Date  . Coronary artery bypass graft  1991  . Appendectomy      as child  . Inguinal hernia repair  1985    No family history on file.  No Known Allergies  Current Outpatient Prescriptions on File Prior to Visit  Medication Sig Dispense Refill  . aspirin 325 MG EC tablet Take 325 mg by mouth 2 (two) times daily.        Marland Kitchen atorvastatin (LIPITOR) 20 MG tablet TAKE 1 TABLET DAILY  90 tablet  3  . enalapril (VASOTEC) 20 MG tablet Take 1 tablet (20 mg total) by  mouth 2 (two) times daily.  180 tablet  3  . glimepiride (AMARYL) 2 MG tablet TAKE 1 TABLET EVERY MORNING  90 tablet  1  . metFORMIN (GLUCOPHAGE) 1000 MG tablet TAKE 1 TABLET TWICE A DAY  180 tablet  1  . metoprolol (LOPRESSOR) 50 MG tablet TAKE 1 TABLET TWICE A DAY  180 tablet  1   No current facility-administered medications on file prior to visit.     patient denies chest pain, shortness of breath, orthopnea. Denies lower extremity edema, abdominal pain, change in appetite, change in bowel movements. Patient denies rashes, musculoskeletal complaints. No other specific complaints in a complete review of systems.   BP 112/70  Pulse 76  Temp(Src) 98.2 F (36.8 C) (Oral)  Wt 177 lb (80.287 kg)  BMI 27.72 kg/m2  well-developed well-nourished male in no acute distress. HEENT exam atraumatic, normocephalic, neck supple without jugular venous distention. Chest clear to auscultation cardiac exam S1-S2 are regular. Abdominal exam overweight with bowel sounds, soft and nontender. Extremities no edema. Neurologic exam is alert with a normal gait.

## 2012-07-11 ENCOUNTER — Ambulatory Visit (INDEPENDENT_AMBULATORY_CARE_PROVIDER_SITE_OTHER): Payer: Medicare Other | Admitting: Internal Medicine

## 2012-07-11 ENCOUNTER — Encounter: Payer: Self-pay | Admitting: Internal Medicine

## 2012-07-11 VITALS — BP 134/90

## 2012-07-11 DIAGNOSIS — M25561 Pain in right knee: Secondary | ICD-10-CM

## 2012-07-11 DIAGNOSIS — M25569 Pain in unspecified knee: Secondary | ICD-10-CM | POA: Diagnosis not present

## 2012-07-13 NOTE — Progress Notes (Signed)
Patient ID: Benjamin Browning, male   DOB: 1945/08/04, 67 y.o.   MRN: [id number] Right knee injected with 40 mg depomedrol after informed consent. Sterile technique  He will call 7 days if sxs persist

## 2012-07-26 ENCOUNTER — Ambulatory Visit (INDEPENDENT_AMBULATORY_CARE_PROVIDER_SITE_OTHER): Payer: Medicare Other | Admitting: Internal Medicine

## 2012-07-26 ENCOUNTER — Encounter: Payer: Self-pay | Admitting: Internal Medicine

## 2012-07-26 VITALS — BP 132/92 | Temp 98.3°F | Wt 176.0 lb

## 2012-07-26 DIAGNOSIS — M25569 Pain in unspecified knee: Secondary | ICD-10-CM

## 2012-07-26 DIAGNOSIS — M25561 Pain in right knee: Secondary | ICD-10-CM | POA: Insufficient documentation

## 2012-07-26 LAB — CBC WITH DIFFERENTIAL/PLATELET
Basophils Absolute: 0.1 10*3/uL (ref 0.0–0.1)
Eosinophils Absolute: 0.1 10*3/uL (ref 0.0–0.7)
Lymphocytes Relative: 33.7 % (ref 12.0–46.0)
MCHC: 33 g/dL (ref 30.0–36.0)
Monocytes Relative: 7.4 % (ref 3.0–12.0)
Neutro Abs: 6 10*3/uL (ref 1.4–7.7)
Neutrophils Relative %: 57.2 % (ref 43.0–77.0)
Platelets: 276 10*3/uL (ref 150.0–400.0)
RDW: 13.1 % (ref 11.5–14.6)

## 2012-07-26 LAB — URIC ACID: Uric Acid, Serum: 4.9 mg/dL (ref 4.0–7.8)

## 2012-07-26 NOTE — Assessment & Plan Note (Signed)
67 year old white male with progressively worsening right knee pain for one year. Recent x-ray was negative for significant degenerative joint disease. He had no improvement with cortisone injection. I suspect his symptoms may be secondary to meniscal tear. Refer to orthopedic specialist for further evaluation and treatment.  Check CBCD, sed rate, and uric acid level

## 2012-07-26 NOTE — Progress Notes (Signed)
  Subjective:    Patient ID: Benjamin Browning, male    DOB: 1945-05-14, 67 y.o.   MRN: [id number]  HPI  67year-old white male with history of diabetes and hypertension complains of ongoing right knee pain. He reports his symptoms have been progressively worsening over one year. There is no report of injury or trauma. He was seen by his primary care physician 1 to 2 months ago. His knee x-ray was negative for significant degenerative joint disease. He received a cortisone injection and he noticed improvement for 2 days but right knee pain returned. He denies any joint redness or swelling. His symptoms are intermittent. He has significant pain when he puts weight on it and with certain movements.  Review of Systems Negative for fever or chills  Past Medical History  Diagnosis Date  . CAD (coronary artery disease)   . Heart attack 2005    w/ Taxus stenting to a native vessel   . Diabetes mellitus 2005    type 2  . Hyperlipidemia   . Heart attack 1991  . Cerebrovascular accident 4/01  . Positive PPD     History   Social History  . Marital Status: Married    Spouse Name: N/A    Number of Children: N/A  . Years of Education: N/A   Occupational History  . Not on file.   Social History Main Topics  . Smoking status: Current Every Day Smoker    Types: Cigars  . Smokeless tobacco: Not on file     Comment: 2 cigars per day  . Alcohol Use: Not on file  . Drug Use: Not on file  . Sexually Active: Not on file   Other Topics Concern  . Not on file   Social History Narrative  . No narrative on file    Past Surgical History  Procedure Laterality Date  . Coronary artery bypass graft  1991  . Appendectomy      as child  . Inguinal hernia repair  1985    No family history on file.  No Known Allergies  Current Outpatient Prescriptions on File Prior to Visit  Medication Sig Dispense Refill  . aspirin 325 MG EC tablet Take 325 mg by mouth 2 (two) times daily.        Marland Kitchen  atorvastatin (LIPITOR) 20 MG tablet TAKE 1 TABLET DAILY  90 tablet  3  . enalapril (VASOTEC) 20 MG tablet Take 1 tablet (20 mg total) by mouth 2 (two) times daily.  180 tablet  3  . glimepiride (AMARYL) 2 MG tablet TAKE 1 TABLET EVERY MORNING  90 tablet  1  . metFORMIN (GLUCOPHAGE) 1000 MG tablet TAKE 1 TABLET TWICE A DAY  180 tablet  1  . metoprolol (LOPRESSOR) 50 MG tablet TAKE 1 TABLET TWICE A DAY  180 tablet  1   No current facility-administered medications on file prior to visit.    BP 132/92  Temp(Src) 98.3 F (36.8 C) (Oral)  Wt 176 lb (79.833 kg)  BMI 27.56 kg/m2       Objective:   Physical Exam  Constitutional: He appears well-developed and well-nourished.  Cardiovascular: Normal rate, regular rhythm and normal heart sounds.   Pulmonary/Chest: Effort normal and breath sounds normal.  Musculoskeletal:  Right knee-no joint instability, no joint swelling or redness Minimal tenderness with compressing medial joint line.  Negative for Apley compression test          Assessment & Plan:

## 2012-09-03 ENCOUNTER — Other Ambulatory Visit: Payer: Self-pay | Admitting: Internal Medicine

## 2012-09-12 DIAGNOSIS — M169 Osteoarthritis of hip, unspecified: Secondary | ICD-10-CM | POA: Diagnosis not present

## 2012-09-17 ENCOUNTER — Other Ambulatory Visit: Payer: Self-pay | Admitting: Internal Medicine

## 2012-10-09 DIAGNOSIS — M169 Osteoarthritis of hip, unspecified: Secondary | ICD-10-CM | POA: Diagnosis not present

## 2012-10-09 DIAGNOSIS — M25559 Pain in unspecified hip: Secondary | ICD-10-CM | POA: Diagnosis not present

## 2012-10-19 DIAGNOSIS — M169 Osteoarthritis of hip, unspecified: Secondary | ICD-10-CM | POA: Diagnosis not present

## 2012-11-27 ENCOUNTER — Other Ambulatory Visit: Payer: Self-pay | Admitting: Internal Medicine

## 2012-12-27 ENCOUNTER — Other Ambulatory Visit: Payer: Self-pay

## 2013-01-02 ENCOUNTER — Ambulatory Visit: Payer: Medicare Other | Admitting: Internal Medicine

## 2013-01-02 DIAGNOSIS — M169 Osteoarthritis of hip, unspecified: Secondary | ICD-10-CM | POA: Diagnosis not present

## 2013-01-08 ENCOUNTER — Ambulatory Visit (INDEPENDENT_AMBULATORY_CARE_PROVIDER_SITE_OTHER): Payer: Medicare Other | Admitting: Internal Medicine

## 2013-01-08 ENCOUNTER — Encounter: Payer: Self-pay | Admitting: Internal Medicine

## 2013-01-08 VITALS — BP 142/82 | HR 72 | Temp 98.1°F | Ht 67.0 in | Wt 180.0 lb

## 2013-01-08 DIAGNOSIS — I119 Hypertensive heart disease without heart failure: Secondary | ICD-10-CM | POA: Diagnosis not present

## 2013-01-08 DIAGNOSIS — E1151 Type 2 diabetes mellitus with diabetic peripheral angiopathy without gangrene: Secondary | ICD-10-CM

## 2013-01-08 DIAGNOSIS — E1159 Type 2 diabetes mellitus with other circulatory complications: Secondary | ICD-10-CM

## 2013-01-08 DIAGNOSIS — I2510 Atherosclerotic heart disease of native coronary artery without angina pectoris: Secondary | ICD-10-CM | POA: Diagnosis not present

## 2013-01-08 DIAGNOSIS — C679 Malignant neoplasm of bladder, unspecified: Secondary | ICD-10-CM

## 2013-01-08 DIAGNOSIS — E785 Hyperlipidemia, unspecified: Secondary | ICD-10-CM | POA: Diagnosis not present

## 2013-01-08 DIAGNOSIS — I798 Other disorders of arteries, arterioles and capillaries in diseases classified elsewhere: Secondary | ICD-10-CM | POA: Diagnosis not present

## 2013-01-08 DIAGNOSIS — F17200 Nicotine dependence, unspecified, uncomplicated: Secondary | ICD-10-CM | POA: Diagnosis not present

## 2013-01-08 LAB — HEPATIC FUNCTION PANEL
ALT: 30 U/L (ref 0–53)
Albumin: 4.5 g/dL (ref 3.5–5.2)
Alkaline Phosphatase: 58 U/L (ref 39–117)
Bilirubin, Direct: 0.1 mg/dL (ref 0.0–0.3)
Total Protein: 7.8 g/dL (ref 6.0–8.3)

## 2013-01-08 LAB — BASIC METABOLIC PANEL
CO2: 24 mEq/L (ref 19–32)
Chloride: 106 mEq/L (ref 96–112)
Creatinine, Ser: 1 mg/dL (ref 0.4–1.5)
Glucose, Bld: 131 mg/dL — ABNORMAL HIGH (ref 70–99)

## 2013-01-08 LAB — LIPID PANEL
HDL: 37.7 mg/dL — ABNORMAL LOW (ref >39.00)
Triglycerides: 389 mg/dL — ABNORMAL HIGH (ref 0.0–149.0)

## 2013-01-08 LAB — MICROALBUMIN / CREATININE URINE RATIO
Creatinine,U: 127.4 mg/dL
Microalb Creat Ratio: 0.9 mg/g (ref 0.0–30.0)
Microalb, Ur: 1.2 mg/dL (ref 0.0–1.9)

## 2013-01-08 LAB — LDL CHOLESTEROL, DIRECT: Direct LDL: 63.4 mg/dL

## 2013-01-08 LAB — HEMOGLOBIN A1C: Hgb A1c MFr Bld: 7 % — ABNORMAL HIGH (ref 4.6–6.5)

## 2013-01-08 NOTE — Assessment & Plan Note (Signed)
Controlled Check labs today

## 2013-01-08 NOTE — Assessment & Plan Note (Signed)
Previously controlled Check in 6 months

## 2013-01-08 NOTE — Assessment & Plan Note (Signed)
No sxs Has regular f/u with urology

## 2013-01-08 NOTE — Assessment & Plan Note (Signed)
Advised absolute abstinence

## 2013-01-08 NOTE — Progress Notes (Signed)
Complicated patient but he has no specific complaints   patient comes in for followup of multiple medical problems including type 2 diabetes, hyperlipidemia, hypertension. The patient does not check blood sugar or blood pressure at home. The patetient does not follow an exercise or diet program. The patient denies any polyuria, polydipsia.  In the past the patient has gone to diabetic treatment center. The patient is tolerating medications  Without difficulty. The patient does admit to medication compliance.   Hip pain- seeing dr. Charlann Boxer. They are considering hip replacement.   Past Medical History  Diagnosis Date  . CAD (coronary artery disease)   . Heart attack 2005    w/ Taxus stenting to a native vessel   . Diabetes mellitus 2005    type 2  . Hyperlipidemia   . Heart attack 1991  . Cerebrovascular accident 4/01  . Positive PPD     History   Social History  . Marital Status: Married    Spouse Name: N/A    Number of Children: N/A  . Years of Education: N/A   Occupational History  . Not on file.   Social History Main Topics  . Smoking status: Current Every Day Smoker    Types: Cigars  . Smokeless tobacco: Not on file     Comment: 2 cigars per day  . Alcohol Use: Not on file  . Drug Use: Not on file  . Sexual Activity: Not on file   Other Topics Concern  . Not on file   Social History Narrative  . No narrative on file    Past Surgical History  Procedure Laterality Date  . Coronary artery bypass graft  1991  . Appendectomy      as child  . Inguinal hernia repair  1985    No family history on file.  No Known Allergies  Current Outpatient Prescriptions on File Prior to Visit  Medication Sig Dispense Refill  . aspirin 325 MG EC tablet Take 325 mg by mouth 2 (two) times daily.        Marland Kitchen atorvastatin (LIPITOR) 20 MG tablet TAKE 1 TABLET DAILY  90 tablet  3  . enalapril (VASOTEC) 20 MG tablet TAKE 1 TABLET TWICE A DAY  180 tablet  3  . glimepiride (AMARYL) 2 MG  tablet TAKE 1 TABLET EVERY MORNING  90 tablet  1  . metFORMIN (GLUCOPHAGE) 1000 MG tablet TAKE 1 TABLET TWICE A DAY  180 tablet  1  . metoprolol (LOPRESSOR) 50 MG tablet TAKE 1 TABLET TWICE A DAY  180 tablet  1   No current facility-administered medications on file prior to visit.     patient denies chest pain, shortness of breath, orthopnea. Denies lower extremity edema, abdominal pain, change in appetite, change in bowel movements. Patient denies rashes, musculoskeletal complaints. No other specific complaints in a complete review of systems.   Reviewed vitals  well-developed well-nourished male in no acute distress. HEENT exam atraumatic, normocephalic, neck supple without jugular venous distention. Chest clear to auscultation cardiac exam S1-S2 are regular. Abdominal exam overweight with bowel sounds, soft and nontender. Extremities no edema. Neurologic exam is alert with a normal gait.  Stop all tobacco products If he were to decide to have THA he should have stress test prior.

## 2013-01-08 NOTE — Assessment & Plan Note (Signed)
Needs f/u labs- check today

## 2013-01-08 NOTE — Progress Notes (Signed)
Pre visit review using our clinic review tool, if applicable. No additional management support is needed unless otherwise documented below in the visit note. 

## 2013-03-03 ENCOUNTER — Other Ambulatory Visit: Payer: Self-pay | Admitting: Internal Medicine

## 2013-03-11 ENCOUNTER — Encounter: Payer: Self-pay | Admitting: Internal Medicine

## 2013-03-11 ENCOUNTER — Other Ambulatory Visit: Payer: Self-pay | Admitting: Internal Medicine

## 2013-05-14 DIAGNOSIS — M169 Osteoarthritis of hip, unspecified: Secondary | ICD-10-CM | POA: Diagnosis not present

## 2013-05-14 DIAGNOSIS — M1610 Unilateral primary osteoarthritis, unspecified hip: Secondary | ICD-10-CM | POA: Diagnosis not present

## 2013-05-20 ENCOUNTER — Other Ambulatory Visit: Payer: Self-pay | Admitting: Internal Medicine

## 2013-06-03 DIAGNOSIS — E119 Type 2 diabetes mellitus without complications: Secondary | ICD-10-CM | POA: Diagnosis not present

## 2013-06-05 ENCOUNTER — Other Ambulatory Visit: Payer: Self-pay | Admitting: Internal Medicine

## 2013-06-21 LAB — HM DIABETES EYE EXAM

## 2013-06-26 ENCOUNTER — Ambulatory Visit: Payer: Medicare Other | Admitting: Internal Medicine

## 2013-06-27 ENCOUNTER — Ambulatory Visit (INDEPENDENT_AMBULATORY_CARE_PROVIDER_SITE_OTHER): Payer: Medicare Other | Admitting: Internal Medicine

## 2013-06-27 ENCOUNTER — Encounter: Payer: Self-pay | Admitting: Internal Medicine

## 2013-06-27 VITALS — BP 138/86 | HR 64 | Temp 98.3°F | Ht 67.0 in | Wt 177.0 lb

## 2013-06-27 DIAGNOSIS — E1159 Type 2 diabetes mellitus with other circulatory complications: Secondary | ICD-10-CM | POA: Diagnosis not present

## 2013-06-27 DIAGNOSIS — I798 Other disorders of arteries, arterioles and capillaries in diseases classified elsewhere: Secondary | ICD-10-CM | POA: Diagnosis not present

## 2013-06-27 DIAGNOSIS — C679 Malignant neoplasm of bladder, unspecified: Secondary | ICD-10-CM

## 2013-06-27 DIAGNOSIS — E1151 Type 2 diabetes mellitus with diabetic peripheral angiopathy without gangrene: Secondary | ICD-10-CM

## 2013-06-27 DIAGNOSIS — Z23 Encounter for immunization: Secondary | ICD-10-CM

## 2013-06-27 DIAGNOSIS — F17200 Nicotine dependence, unspecified, uncomplicated: Secondary | ICD-10-CM | POA: Diagnosis not present

## 2013-06-27 LAB — BASIC METABOLIC PANEL
BUN: 14 mg/dL (ref 6–23)
CALCIUM: 9.5 mg/dL (ref 8.4–10.5)
CO2: 22 mEq/L (ref 19–32)
CREATININE: 1.1 mg/dL (ref 0.4–1.5)
Chloride: 104 mEq/L (ref 96–112)
GFR: 73.64 mL/min (ref >60.00)
Glucose, Bld: 87 mg/dL (ref 70–99)
Potassium: 4.4 mEq/L (ref 3.5–5.1)
SODIUM: 134 meq/L — AB (ref 135–145)

## 2013-06-27 LAB — LIPID PANEL
Cholesterol: 135 mg/dL (ref 0–200)
HDL: 40.1 mg/dL (ref >39.00)
LDL CALC: 46 mg/dL (ref 0–99)
Total CHOL/HDL Ratio: 3
Triglycerides: 243 mg/dL — ABNORMAL HIGH (ref 0.0–149.0)
VLDL: 48.6 mg/dL — AB (ref 0.0–40.0)

## 2013-06-27 LAB — HEPATIC FUNCTION PANEL
ALBUMIN: 4.4 g/dL (ref 3.5–5.2)
ALK PHOS: 57 U/L (ref 39–117)
ALT: 23 U/L (ref 0–53)
AST: 22 U/L (ref 0–37)
BILIRUBIN DIRECT: 0 mg/dL (ref 0.0–0.3)
Total Bilirubin: 0.7 mg/dL (ref 0.2–1.2)
Total Protein: 7.7 g/dL (ref 6.0–8.3)

## 2013-06-27 LAB — MICROALBUMIN / CREATININE URINE RATIO
CREATININE, U: 70.4 mg/dL
Microalb Creat Ratio: 1.1 mg/g (ref 0.0–30.0)
Microalb, Ur: 0.8 mg/dL (ref 0.0–1.9)

## 2013-06-27 LAB — HEMOGLOBIN A1C: HEMOGLOBIN A1C: 7.4 % — AB (ref 4.6–6.5)

## 2013-06-27 LAB — HM DIABETES FOOT EXAM

## 2013-06-27 NOTE — Progress Notes (Signed)
Pre visit review using our clinic review tool, if applicable. No additional management support is needed unless otherwise documented below in the visit note. 

## 2013-06-28 NOTE — Assessment & Plan Note (Signed)
Check labs today.

## 2013-06-28 NOTE — Assessment & Plan Note (Signed)
He does not see urology any more

## 2013-06-28 NOTE — Assessment & Plan Note (Signed)
He refuses to quit

## 2013-06-28 NOTE — Progress Notes (Signed)
Pt is here for dm and lab f/u He does not monitor home bps or cbgs No polyuria, plydipsia  Past Medical History  Diagnosis Date  . CAD (coronary artery disease)   . Heart attack 2005    w/ Taxus stenting to a native vessel   . Diabetes mellitus 2005    type 2  . Hyperlipidemia   . Heart attack 1991  . Cerebrovascular accident 4/01  . Positive PPD     History   Social History  . Marital Status: Married    Spouse Name: N/A    Number of Children: N/A  . Years of Education: N/A   Occupational History  . Not on file.   Social History Main Topics  . Smoking status: Current Every Day Smoker    Types: Cigars  . Smokeless tobacco: Not on file     Comment: 2 cigars per day  . Alcohol Use: Not on file  . Drug Use: Not on file  . Sexual Activity: Not on file   Other Topics Concern  . Not on file   Social History Narrative  . No narrative on file    Past Surgical History  Procedure Laterality Date  . Coronary artery bypass graft  1991  . Appendectomy      as child  . Inguinal hernia repair  1985    No family history on file.  No Known Allergies  Current Outpatient Prescriptions on File Prior to Visit  Medication Sig Dispense Refill  . aspirin 325 MG EC tablet Take 325 mg by mouth daily.       Marland Kitchen atorvastatin (LIPITOR) 20 MG tablet TAKE 1 TABLET DAILY  90 tablet  3  . enalapril (VASOTEC) 20 MG tablet TAKE 1 TABLET TWICE A DAY  180 tablet  3  . glimepiride (AMARYL) 2 MG tablet TAKE 1 TABLET EVERY MORNING  90 tablet  1  . metFORMIN (GLUCOPHAGE) 1000 MG tablet TAKE 1 TABLET TWICE A DAY  180 tablet  1  . metoprolol (LOPRESSOR) 50 MG tablet TAKE 1 TABLET TWICE A DAY  180 tablet  1   No current facility-administered medications on file prior to visit.     patient denies chest pain, shortness of breath, orthopnea. Denies lower extremity edema, abdominal pain, change in appetite, change in bowel movements. Patient denies rashes, musculoskeletal complaints. No other  specific complaints in a complete review of systems.   BP 138/86  Pulse 64  Temp(Src) 98.3 F (36.8 C) (Oral)  Ht 5\' 7"  (1.702 m)  Wt 177 lb (80.287 kg)  BMI 27.72 kg/m2  well-developed well-nourished male in no acute distress. HEENT exam atraumatic, normocephalic, neck supple without jugular venous distention. Chest clear to auscultation cardiac exam S1-S2 are regular. Abdominal exam overweight with bowel sounds, soft and nontender. Extremities no edema. Neurologic exam is alert with a normal gait.  TOBACCO USE He refuses to quit  BLADDER CANCER He does not see urology any more  Diabetic peripheral vascular disease Check labs today

## 2013-07-16 ENCOUNTER — Telehealth: Payer: Self-pay

## 2013-07-16 NOTE — Telephone Encounter (Signed)
Relevant patient education assigned to patient using Emmi. ° °

## 2013-08-14 DIAGNOSIS — M169 Osteoarthritis of hip, unspecified: Secondary | ICD-10-CM | POA: Diagnosis not present

## 2013-08-14 DIAGNOSIS — M1610 Unilateral primary osteoarthritis, unspecified hip: Secondary | ICD-10-CM | POA: Diagnosis not present

## 2013-09-24 ENCOUNTER — Telehealth: Payer: Self-pay | Admitting: Internal Medicine

## 2013-09-24 MED ORDER — METFORMIN HCL 1000 MG PO TABS: ORAL_TABLET | ORAL | Status: DC

## 2013-09-24 NOTE — Telephone Encounter (Signed)
rx sent in electronically 

## 2013-09-24 NOTE — Telephone Encounter (Signed)
Benjamin Browning, Benjamin Browning is requesting re-fill on metFORMIN (GLUCOPHAGE) 1000 MG tablet

## 2013-10-04 ENCOUNTER — Telehealth: Payer: Self-pay | Admitting: Internal Medicine

## 2013-10-04 MED ORDER — METFORMIN HCL 1000 MG PO TABS: ORAL_TABLET | ORAL | Status: DC

## 2013-10-04 NOTE — Telephone Encounter (Signed)
rx resent in electronically

## 2013-10-04 NOTE — Telephone Encounter (Signed)
Pt states [facility] has not received metFORMIN (GLUCOPHAGE) 1000 MG tablet rx. Can you resend.

## 2013-10-10 ENCOUNTER — Other Ambulatory Visit: Payer: Self-pay | Admitting: Internal Medicine

## 2013-11-12 ENCOUNTER — Other Ambulatory Visit: Payer: Self-pay | Admitting: Internal Medicine

## 2013-11-16 ENCOUNTER — Encounter: Payer: Self-pay | Admitting: Internal Medicine

## 2013-11-28 ENCOUNTER — Encounter: Payer: Self-pay | Admitting: Gastroenterology

## 2013-12-09 ENCOUNTER — Other Ambulatory Visit: Payer: Self-pay | Admitting: Internal Medicine

## 2013-12-11 ENCOUNTER — Telehealth: Payer: Self-pay | Admitting: Internal Medicine

## 2013-12-11 MED ORDER — METOPROLOL TARTRATE 50 MG PO TABS: ORAL_TABLET | ORAL | Status: DC

## 2013-12-11 NOTE — Telephone Encounter (Signed)
Pt is waiting on mailorder in needs 10 day supply metoprolol 50 mg sent to  [facility] [address] in [place]

## 2013-12-11 NOTE — Telephone Encounter (Signed)
rx sent in electronically 

## 2013-12-31 ENCOUNTER — Encounter: Payer: Self-pay | Admitting: Family Medicine

## 2013-12-31 ENCOUNTER — Ambulatory Visit (INDEPENDENT_AMBULATORY_CARE_PROVIDER_SITE_OTHER): Payer: Medicare Other | Admitting: Family Medicine

## 2013-12-31 ENCOUNTER — Ambulatory Visit (INDEPENDENT_AMBULATORY_CARE_PROVIDER_SITE_OTHER): Payer: Medicare Other

## 2013-12-31 ENCOUNTER — Ambulatory Visit: Payer: Medicare Other | Admitting: Physician Assistant

## 2013-12-31 VITALS — BP 170/84 | Temp 98.5°F | Wt 180.0 lb

## 2013-12-31 DIAGNOSIS — Z66 Do not resuscitate: Secondary | ICD-10-CM

## 2013-12-31 DIAGNOSIS — Z23 Encounter for immunization: Secondary | ICD-10-CM | POA: Diagnosis not present

## 2013-12-31 DIAGNOSIS — Z720 Tobacco use: Secondary | ICD-10-CM

## 2013-12-31 DIAGNOSIS — F17200 Nicotine dependence, unspecified, uncomplicated: Secondary | ICD-10-CM

## 2013-12-31 DIAGNOSIS — I10 Essential (primary) hypertension: Secondary | ICD-10-CM

## 2013-12-31 DIAGNOSIS — E1165 Type 2 diabetes mellitus with hyperglycemia: Secondary | ICD-10-CM | POA: Diagnosis not present

## 2013-12-31 DIAGNOSIS — E785 Hyperlipidemia, unspecified: Secondary | ICD-10-CM | POA: Insufficient documentation

## 2013-12-31 DIAGNOSIS — IMO0002 Reserved for concepts with insufficient information to code with codable children: Secondary | ICD-10-CM | POA: Insufficient documentation

## 2013-12-31 LAB — COMPREHENSIVE METABOLIC PANEL
ALT: 23 U/L (ref 0–53)
AST: 20 U/L (ref 0–37)
Albumin: 3.9 g/dL (ref 3.5–5.2)
Alkaline Phosphatase: 60 U/L (ref 39–117)
BILIRUBIN TOTAL: 0.5 mg/dL (ref 0.2–1.2)
BUN: 17 mg/dL (ref 6–23)
CHLORIDE: 104 meq/L (ref 96–112)
CO2: 26 mEq/L (ref 19–32)
CREATININE: 1.2 mg/dL (ref 0.4–1.5)
Calcium: 10.1 mg/dL (ref 8.4–10.5)
GFR: 63.11 mL/min (ref >60.00)
Glucose, Bld: 111 mg/dL — ABNORMAL HIGH (ref 70–99)
Potassium: 5.5 mEq/L — ABNORMAL HIGH (ref 3.5–5.1)
Sodium: 141 mEq/L (ref 135–145)
Total Protein: 8 g/dL (ref 6.0–8.3)

## 2013-12-31 LAB — HEMOGLOBIN A1C: Hgb A1c MFr Bld: 7.3 % — ABNORMAL HIGH (ref 4.6–6.5)

## 2013-12-31 MED ORDER — AMLODIPINE BESYLATE 10 MG PO TABS: 10.0000 mg | ORAL_TABLET | Freq: Every day | ORAL | Status: DC

## 2013-12-31 MED ORDER — METOPROLOL TARTRATE 50 MG PO TABS: ORAL_TABLET | ORAL | Status: DC

## 2013-12-31 MED ORDER — ATORVASTATIN CALCIUM 20 MG PO TABS: ORAL_TABLET | ORAL | Status: DC

## 2013-12-31 MED ORDER — GLIMEPIRIDE 2 MG PO TABS: ORAL_TABLET | ORAL | Status: DC

## 2013-12-31 MED ORDER — ENALAPRIL MALEATE 20 MG PO TABS: ORAL_TABLET | ORAL | Status: DC

## 2013-12-31 NOTE — Assessment & Plan Note (Addendum)
Continue enalapril and metoprolol. Given poor control we will add amlodpine 5mg . Follow up in 1 week.

## 2013-12-31 NOTE — Patient Instructions (Signed)
See me in 1 week to follow up on blood pressure. Only take 1/2 a pill of amlodipine 10mg  until I see you back.   Update labs today.   Otherwise continue current medications.

## 2013-12-31 NOTE — Assessment & Plan Note (Signed)
Decision clarified today. Yellow form needs to be signed at next visit.

## 2013-12-31 NOTE — Assessment & Plan Note (Addendum)
I think < 7.5 or 8 is a reasonable goal although ideally below 7. Balancing patient quality of life with hypoglycemia risk on increased amaryl-hold steady. Patient at 7.3 today. Continue metformin 1g BID and amaryl 2g

## 2013-12-31 NOTE — Progress Notes (Signed)
Benjamin Reddish, MD Phone: [phone]  Subjective:  Patient presents today to establish care with me as their new primary care provider. Patient was formerly a patient of Dr. Leanne Chang. Chief complaint-noted.   Patient and I had a long discussion about goals of care. He feels that he has lived a long life. He states quality of life is more important to him than most things. For this reason, he wants to continue to smoke. He also is DNR/DNI for this reason.  In DIABETES Type II-mild poor control  Lab Results  Component Value Date   HGBA1C 7.3* 12/31/2013   HGBA1C 7.4* 06/27/2013   HGBA1C 7.0* 01/08/2013  Medications taking and tolerating-yes, metformin 1g BID, amaryl 2mg  Blood Sugars per patient-fasting- does not check Regular Exercise-none, due to knees and hips-advised water aerobics On Aspirin-yes On statin-yes Daily foot monitoring-yes  Hypertension-poor control  On enalapril 20mg  BP Readings from Last 3 Encounters:  12/31/13 170/84  06/27/13 138/86  01/08/13 142/82   Home BP monitoring-no Compliant with medications-yes without side effects ROS-Denies any CP, HA, SOB, blurry vision  The following were reviewed and entered/updated in epic: Past Medical History  Diagnosis Date  . CAD (coronary artery disease)   . Heart attack 2005    w/ Taxus stenting to a native vessel   . Diabetes mellitus 2005    type 2  . Hyperlipidemia   . Heart attack 1991  . Cerebrovascular accident 4/01  . Positive PPD    Patient Active Problem List   Diagnosis Date Noted  . DNR (do not resuscitate) 12/31/2013    Priority: High  . TOBACCO USE 03/17/2008    Priority: High  . Diabetes mellitus type II, uncontrolled 08/21/2006    Priority: High  . CAD (coronary artery disease) 08/21/2006    Priority: High  . Hyperlipidemia 12/31/2013    Priority: Medium  . Essential hypertension 01/22/2007    Priority: Medium  . Malignant neoplasm of bladder 08/21/2006    Priority: Medium  . History  of CVA (cerebrovascular accident) 08/21/2006    Priority: Medium   Past Surgical History  Procedure Laterality Date  . Coronary artery bypass graft  1991  . Appendectomy      as child  . Inguinal hernia repair  1985    Family History  Problem Relation Age of Onset  . Hodgkin's lymphoma Father     died in 54s   Medications- reviewed and updated Current Outpatient Prescriptions  Medication Sig Dispense Refill  . aspirin 325 MG EC tablet Take 325 mg by mouth daily.     Marland Kitchen atorvastatin (LIPITOR) 20 MG tablet TAKE 1 TABLET DAILY 90 tablet 3  . enalapril (VASOTEC) 20 MG tablet TAKE 1 TABLET TWICE A DAY 180 tablet 3  . glimepiride (AMARYL) 2 MG tablet TAKE 1 TABLET EVERY MORNING 90 tablet 3  . metFORMIN (GLUCOPHAGE) 1000 MG tablet TAKE 1 TABLET TWICE A DAY 180 tablet 2  . metoprolol (LOPRESSOR) 50 MG tablet TAKE 1 TABLET TWICE A DAY 180 tablet 3  . amLODipine (NORVASC) 10 MG tablet Take 1 tablet (10 mg total) by mouth daily. 90 tablet 3   No current facility-administered medications for this visit.   Allergies-reviewed and updated No Known Allergies  History   Social History  . Marital Status: Married    Spouse Name: N/A    Number of Children: N/A  . Years of Education: N/A   Social History Main Topics  . Smoking status: Current Every Day Smoker  Types: Cigars  . Smokeless tobacco: None     Comment: 2 cigars per day  . Alcohol Use: 0.0 oz/week    0 Not specified per week     Comment: 2 beers per month  . Drug Use: No  . Sexual Activity: None   Other Topics Concern  . None   Social History Narrative   Separated. 3 kids. 10 grandkids.       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       Hobbies: reading, used to play a lot of golf    ROS--See HPI   Objective: BP 170/84 mmHg  Temp(Src) 98.5 F (36.9 C)  Wt 180 lb (81.647 kg) Gen: NAD, resting comfortably in chair CV: RRR no murmurs rubs or gallops Lungs: CTAB no crackles, wheeze, rhonchi Abdomen:  soft/nontender/nondistended/normal bowel sounds.  Ext: no edema Skin: warm, dry, no rash Neuro: grossly normal, moves all extremities   Assessment/Plan:  TOBACCO USE Patient states this is for quality of life. He is not interested in quitting. Not interested in prolonging his life. Told patient I would respect this decision.   Diabetes mellitus type II, uncontrolled I think < 7.5 or 8 is a reasonable goal although ideally below 7. Balancing patient quality of life with hypoglycemia risk on increased amaryl-hold steady. Patient at 7.3 today. Continue metformin 1g BID and amaryl 2g  Essential hypertension Continue enalapril and metoprolol. Given poor control we will add amlodpine 5mg . Follow up in 1 week.  DNR (do not resuscitate) Decision clarified today. Yellow form needs to be signed at next visit.    >50% of 25 minute office visit was spent on counseling (advanced directives, clarifying goals of care, DM management) and coordination of care  nonfasting Orders Placed This Encounter  Procedures  . Hemoglobin A1c    [place]  . Comprehensive metabolic panel    [place]   Refilled all medications.  Meds ordered this encounter  Medications  . enalapril (VASOTEC) 20 MG tablet    Sig: TAKE 1 TABLET TWICE A DAY    Dispense:  180 tablet    Refill:  3  . glimepiride (AMARYL) 2 MG tablet    Sig: TAKE 1 TABLET EVERY MORNING    Dispense:  90 tablet    Refill:  3  . metoprolol (LOPRESSOR) 50 MG tablet    Sig: TAKE 1 TABLET TWICE A DAY    Dispense:  180 tablet    Refill:  3  . atorvastatin (LIPITOR) 20 MG tablet    Sig: TAKE 1 TABLET DAILY    Dispense:  90 tablet    Refill:  3  . amLODipine (NORVASC) 10 MG tablet    Sig: Take 1 tablet (10 mg total) by mouth daily.    Dispense:  90 tablet    Refill:  3

## 2013-12-31 NOTE — Assessment & Plan Note (Signed)
Patient states this is for quality of life. He is not interested in quitting. Not interested in prolonging his life. Told patient I would respect this decision.

## 2014-01-01 ENCOUNTER — Telehealth: Payer: Self-pay | Admitting: Family Medicine

## 2014-01-01 MED ORDER — AMLODIPINE BESYLATE 10 MG PO TABS: 10.0000 mg | ORAL_TABLET | Freq: Every day | ORAL | Status: DC

## 2014-01-01 NOTE — Telephone Encounter (Signed)
Medication sent in to [facility], unable to Cancel Rx to mailorder due to not having member ID #

## 2014-01-01 NOTE — Telephone Encounter (Signed)
Pt states his amLODipine (NORVASC) 10 MG tablet should have gone to [id number] - , [place] not mail order.  He would like for you to cancel the mail order RX and re-send it to [facility].

## 2014-01-07 ENCOUNTER — Encounter: Payer: Self-pay | Admitting: Family Medicine

## 2014-01-07 ENCOUNTER — Ambulatory Visit (INDEPENDENT_AMBULATORY_CARE_PROVIDER_SITE_OTHER): Payer: Medicare Other | Admitting: Family Medicine

## 2014-01-07 VITALS — BP 140/78 | Temp 98.1°F | Wt 179.0 lb

## 2014-01-07 DIAGNOSIS — E875 Hyperkalemia: Secondary | ICD-10-CM | POA: Diagnosis not present

## 2014-01-07 DIAGNOSIS — I10 Essential (primary) hypertension: Secondary | ICD-10-CM

## 2014-01-07 NOTE — Patient Instructions (Addendum)
Blood pressure just a hair above goal. Start taking full amlodipine.   Let;s see each other in 6 months for recheck.   Check potassium today. If still high, we may need to cut the enalapril down and have you in for a recheck.

## 2014-01-07 NOTE — Assessment & Plan Note (Signed)
Systolic blood pressure very mildly elevated. Patient was only taking 5 mg of amlodipine as instructed. We will increase to 10 mg. Given he is so close to excellent control with this regimen. We will not have close follow-up on this but instead see patient in 6 months. This plan is pending resolution of his hyperkalemia on repeat lab (? Lab error/false positive). If still elevated, would plan on decrease enalapril to once daily and repeat Bmet in a week

## 2014-01-07 NOTE — Progress Notes (Signed)
  Garret Reddish, MD Phone: [phone]  Subjective:   Benjamin Browning is a 68 y.o. year old very pleasant male patient who presents with the following:  Hypertension-much improved control on amlodipine Hyperkalemia-first time reading high on 12/31/13 at 5.5 BP Readings from Last 3 Encounters:  01/07/14 140/78  12/31/13 170/84  06/27/13 138/86  Home BP monitoring-no Compliant with medications-yes without side effects specifically no ankle swelling ROS-Denies any HA,blurry vision, LE edema In regards to hyperkalemia he has no palpitations or chest pain. Or weakness or fatigue.   Past Medical History- Patient Active Problem List   Diagnosis Date Noted  . DNR (do not resuscitate) 12/31/2013    Priority: High  . TOBACCO USE 03/17/2008    Priority: High  . Diabetes mellitus type II, uncontrolled 08/21/2006    Priority: High  . CAD (coronary artery disease) 08/21/2006    Priority: High  . Hyperlipidemia 12/31/2013    Priority: Medium  . Essential hypertension 01/22/2007    Priority: Medium  . Malignant neoplasm of bladder 08/21/2006    Priority: Medium  . History of CVA (cerebrovascular accident) 08/21/2006    Priority: Medium   Medications- reviewed and updated Current Outpatient Prescriptions  Medication Sig Dispense Refill  . amLODipine (NORVASC) 10 MG tablet Take 1/2 tablet (5 mg total) by mouth daily. 90 tablet 0  . aspirin 325 MG EC tablet Take 325 mg by mouth daily.     Marland Kitchen atorvastatin (LIPITOR) 20 MG tablet TAKE 1 TABLET DAILY 90 tablet 3  . enalapril (VASOTEC) 20 MG tablet TAKE 1 TABLET TWICE A DAY 180 tablet 3  . glimepiride (AMARYL) 2 MG tablet TAKE 1 TABLET EVERY MORNING 90 tablet 3  . metFORMIN (GLUCOPHAGE) 1000 MG tablet TAKE 1 TABLET TWICE A DAY 180 tablet 2  . metoprolol (LOPRESSOR) 50 MG tablet TAKE 1 TABLET TWICE A DAY 180 tablet 3   No current facility-administered medications for this visit.    Objective: BP 140/78 mmHg  Temp(Src) 98.1 F (36.7 C)   Wt 179 lb (81.194 kg) Gen: NAD, resting comfortably in chair CV: RRR no murmurs rubs or gallops Lungs: CTAB no crackles, wheeze, rhonchi Ext: no edema Skin: warm, dry, no rash   Assessment/Plan:  Essential hypertension Hyperkalemia Systolic blood pressure very mildly elevated. Patient was only taking 5 mg of amlodipine as instructed. We will increase to 10 mg. Given he is so close to excellent control with this regimen. We will not have close follow-up on this but instead see patient in 6 months. This plan is pending resolution of his hyperkalemia on repeat lab (? Lab error/false positive). If still elevated, would plan on decrease enalapril to once daily and repeat Bmet in a week   Return precautions advised (palpitations, chest pain, weakness)   Orders Placed This Encounter  Procedures  . Basic metabolic panel    [place]

## 2014-01-08 ENCOUNTER — Telehealth: Payer: Self-pay

## 2014-01-08 LAB — BASIC METABOLIC PANEL
BUN: 18 mg/dL (ref 6–23)
CHLORIDE: 106 meq/L (ref 96–112)
CO2: 25 mEq/L (ref 19–32)
Calcium: 10 mg/dL (ref 8.4–10.5)
Creatinine, Ser: 1.2 mg/dL (ref 0.4–1.5)
GFR: 61.93 mL/min (ref >60.00)
Glucose, Bld: 117 mg/dL — ABNORMAL HIGH (ref 70–99)
POTASSIUM: 5.7 meq/L — AB (ref 3.5–5.1)
Sodium: 141 mEq/L (ref 135–145)

## 2014-01-08 NOTE — Telephone Encounter (Signed)
Mrs. Benjamin Browning can you please call pt him and schedule him a lab visit for this Fri please per Dr. Yong Channel, labs are already entered. Thank you

## 2014-01-08 NOTE — Addendum Note (Signed)
Addended by: Clyde Lundborg A on: 01/08/2014 04:06 PM   Modules accepted: Orders

## 2014-01-09 NOTE — Telephone Encounter (Signed)
No labs in the system

## 2014-01-10 ENCOUNTER — Telehealth: Payer: Self-pay

## 2014-01-10 DIAGNOSIS — E875 Hyperkalemia: Secondary | ICD-10-CM

## 2014-01-10 NOTE — Telephone Encounter (Signed)
Noted  

## 2014-01-10 NOTE — Telephone Encounter (Signed)
error 

## 2014-01-10 NOTE — Telephone Encounter (Signed)
Pt scheduled for 01/20/14. He said he is out of town next week 11/23

## 2014-01-10 NOTE — Telephone Encounter (Signed)
Lab was placed in system on 01/07/14. Its a Bmet,

## 2014-01-20 ENCOUNTER — Other Ambulatory Visit (INDEPENDENT_AMBULATORY_CARE_PROVIDER_SITE_OTHER): Payer: Medicare Other

## 2014-01-20 DIAGNOSIS — E875 Hyperkalemia: Secondary | ICD-10-CM | POA: Diagnosis not present

## 2014-01-20 LAB — BASIC METABOLIC PANEL
BUN: 20 mg/dL (ref 6–23)
CO2: 22 mEq/L (ref 19–32)
CREATININE: 1.3 mg/dL (ref 0.4–1.5)
Calcium: 10 mg/dL (ref 8.4–10.5)
Chloride: 102 mEq/L (ref 96–112)
GFR: 59.14 mL/min — AB (ref >60.00)
GLUCOSE: 123 mg/dL — AB (ref 70–99)
Potassium: 5.3 mEq/L — ABNORMAL HIGH (ref 3.5–5.1)
Sodium: 136 mEq/L (ref 135–145)

## 2014-01-24 ENCOUNTER — Telehealth: Payer: Self-pay

## 2014-01-24 NOTE — Telephone Encounter (Signed)
I apologize, Did not realize pt had already had this done, disregard. Thanks

## 2014-01-24 NOTE — Telephone Encounter (Signed)
Pt said he had lab drawn on 01/20/14 why does he need another one

## 2014-01-24 NOTE — Telephone Encounter (Signed)
Mrs. Benjamin Browning can you please get pt to come in to have lab drawn per Dr. Yong Channel? Order is in system.

## 2014-01-24 NOTE — Telephone Encounter (Signed)
Benjamin Browning see below message

## 2014-01-25 ENCOUNTER — Other Ambulatory Visit: Payer: Self-pay | Admitting: Internal Medicine

## 2014-01-26 ENCOUNTER — Other Ambulatory Visit: Payer: Self-pay | Admitting: Internal Medicine

## 2014-01-27 ENCOUNTER — Telehealth: Payer: Self-pay

## 2014-01-27 NOTE — Telephone Encounter (Signed)
Mrs. Benjamin Browning per Dr. Yong Channel pt does need another BMET drawn in addition to the lab drawn on the 01/20/14 as well as having his BP checked when he comes in, please schedule him a lab visit and i will check his BP when he comes in. The order is still in the system

## 2014-01-28 NOTE — Telephone Encounter (Signed)
Pt said he would like a call back about why he need to keep having lab drawn  [phone]

## 2014-01-29 NOTE — Telephone Encounter (Signed)
Dr. Yong Channel what would you like for me to tell pt when I call him back?

## 2014-01-29 NOTE — Telephone Encounter (Signed)
His potassium remains high which can be dangerous to the heart. i am trying to work with him through medication changes to correct this. If we cannot, may have to do further evaluation. Need this lab to know if he needs further evaluation for high potassium or if it is just medication related.

## 2014-01-29 NOTE — Telephone Encounter (Signed)
LM on pt VM with information

## 2014-02-06 ENCOUNTER — Other Ambulatory Visit: Payer: Self-pay | Admitting: Internal Medicine

## 2014-02-08 ENCOUNTER — Other Ambulatory Visit: Payer: Self-pay | Admitting: Internal Medicine

## 2014-03-08 ENCOUNTER — Other Ambulatory Visit: Payer: Self-pay | Admitting: Internal Medicine

## 2014-03-28 ENCOUNTER — Encounter: Payer: Self-pay | Admitting: Family Medicine

## 2014-03-28 ENCOUNTER — Ambulatory Visit (INDEPENDENT_AMBULATORY_CARE_PROVIDER_SITE_OTHER): Payer: Medicare Other | Admitting: Family Medicine

## 2014-03-28 VITALS — BP 142/88 | HR 76 | Temp 98.2°F | Ht 67.0 in | Wt 180.0 lb

## 2014-03-28 DIAGNOSIS — I10 Essential (primary) hypertension: Secondary | ICD-10-CM | POA: Diagnosis not present

## 2014-03-28 DIAGNOSIS — E785 Hyperlipidemia, unspecified: Secondary | ICD-10-CM

## 2014-03-28 LAB — LIPID PANEL
Cholesterol: 131 mg/dL (ref 0–200)
HDL: 40.4 mg/dL (ref >39.00)
LDL Cholesterol: 51 mg/dL (ref 0–99)
NONHDL: 90.6
Total CHOL/HDL Ratio: 3
Triglycerides: 199 mg/dL — ABNORMAL HIGH (ref 0.0–149.0)
VLDL: 39.8 mg/dL (ref 0.0–40.0)

## 2014-03-28 LAB — BASIC METABOLIC PANEL
BUN: 19 mg/dL (ref 6–23)
CHLORIDE: 106 meq/L (ref 96–112)
CO2: 26 mEq/L (ref 19–32)
Calcium: 10.1 mg/dL (ref 8.4–10.5)
Creatinine, Ser: 1.27 mg/dL (ref 0.40–1.50)
GFR: 59.64 mL/min — ABNORMAL LOW (ref >60.00)
Glucose, Bld: 117 mg/dL — ABNORMAL HIGH (ref 70–99)
Potassium: 5.1 mEq/L (ref 3.5–5.1)
Sodium: 142 mEq/L (ref 135–145)

## 2014-03-28 NOTE — Progress Notes (Signed)
Pre visit review using our clinic review tool, if applicable. No additional management support is needed unless otherwise documented below in the visit note. 

## 2014-03-28 NOTE — Assessment & Plan Note (Signed)
Repeat lipids today as needs BMET. Continue atorvastatin 20mg 

## 2014-03-28 NOTE — Patient Instructions (Addendum)
Check potassium and lipids again today. If potassium still high, may need to switch the enalapril out for another medicine  DNR form given. Post in your home.   Avoid any cough medicine with phenylephrine or sudafed  You can take acetaminophen, dextromethorphan, mucinex.   As long as labs ok, 6 months follow up. May need to see you sooner if we switch medicines

## 2014-03-28 NOTE — Assessment & Plan Note (Signed)
Reasonable control on Enalapril 20mg  daily, metoprolol 50mg  BID, amlodipine 10mg . Had planned stopping enalapril previously by phone but patient continued. If potassium is still high, would either need to stop or add HCTZ. Patient hopes to minimize meds so likely would switch. He requests fewest medications possible so we will target SBP <150.

## 2014-03-28 NOTE — Progress Notes (Signed)
  Garret Reddish, MD Phone: [phone]  Subjective:   Benjamin Browning is a 69 y.o. year old very pleasant male patient who presents with the following:  Hypertension-reasonable control  BP Readings from Last 3 Encounters:  03/28/14 142/88  01/07/14 140/78  12/31/13 170/84  Home BP monitoring-no Compliant with medications-yes without side effects ROS-Denies any CP, HA, SOB, blurry vision, LE edema. No extremity weakness or cramps, no palpitations.   Hyperlipidemia-previously well controlled  Lab Results  Component Value Date   LDLCALC 46 06/27/2013  On statin: atorvastatin 20mg  Regular exercise: no ROS- no chest pain or shortness of breath. No myalgias  Past Medical History- Patient Active Problem List   Diagnosis Date Noted  . DNR (do not resuscitate) 12/31/2013    Priority: High  . TOBACCO USE 03/17/2008    Priority: High  . Diabetes mellitus type II, uncontrolled 08/21/2006    Priority: High  . CAD (coronary artery disease) 08/21/2006    Priority: High  . Hyperlipidemia 12/31/2013    Priority: Medium  . Essential hypertension 01/22/2007    Priority: Medium  . Malignant neoplasm of bladder 08/21/2006    Priority: Medium  . History of CVA (cerebrovascular accident) 08/21/2006    Priority: Medium   Medications- reviewed and updated Current Outpatient Prescriptions  Medication Sig Dispense Refill  . amLODipine (NORVASC) 10 MG tablet Take 1 tablet (10 mg total) by mouth daily. 90 tablet 0  . aspirin 325 MG EC tablet Take 325 mg by mouth daily.     Marland Kitchen atorvastatin (LIPITOR) 20 MG tablet TAKE 1 TABLET DAILY 90 tablet 3  . enalapril (VASOTEC) 20 MG tablet TAKE 1 TABLET daily 180 tablet 1  . glimepiride (AMARYL) 2 MG tablet TAKE 1 TABLET EVERY MORNING 90 tablet 1  . metFORMIN (GLUCOPHAGE) 1000 MG tablet TAKE 1 TABLET TWICE A DAY 180 tablet 2  . metoprolol (LOPRESSOR) 50 MG tablet TAKE 1 TABLET TWICE A DAY 180 tablet 1   Objective: BP 142/88 mmHg  Pulse 76  Temp(Src)  98.2 F (36.8 C) (Oral)  Ht 5\' 7"  (1.702 m)  Wt 180 lb (81.647 kg)  BMI 28.19 kg/m2 Gen: NAD, resting comfortably in chair CV: RRR no murmurs rubs or gallops Lungs: CTAB no crackles, wheeze, rhonchi Abdomen: soft/nontender/nondistended/normal bowel sounds. Obese Ext: no edema Skin: warm, dry, no rash  Neuro: grossly normal, moves all extremities  Assessment/Plan:  Essential hypertension Reasonable control on Enalapril 20mg  daily, metoprolol 50mg  BID, amlodipine 10mg . Had planned stopping enalapril previously by phone but patient continued. If potassium is still high, would either need to stop or add HCTZ. Patient hopes to minimize meds so likely would switch. He requests fewest medications possible so we will target SBP <150.    Hyperlipidemia Repeat lipids today as needs BMET. Continue atorvastatin 20mg     Return precautions advised.  6 month f/u planned unless hyperkalemia, see sooner   Orders Placed This Encounter  Procedures  . Basic metabolic panel    [place]    Order Specific Question:  Has the patient fasted?    Answer:  No  . Lipid panel    [place]    Order Specific Question:  Has the patient fasted?    Answer:  No

## 2014-04-23 DIAGNOSIS — E119 Type 2 diabetes mellitus without complications: Secondary | ICD-10-CM | POA: Diagnosis not present

## 2014-04-23 LAB — HM DIABETES EYE EXAM

## 2014-06-13 ENCOUNTER — Other Ambulatory Visit: Payer: Self-pay | Admitting: Internal Medicine

## 2014-06-18 ENCOUNTER — Encounter: Payer: Self-pay | Admitting: Family Medicine

## 2014-07-05 ENCOUNTER — Other Ambulatory Visit: Payer: Self-pay | Admitting: Internal Medicine

## 2014-08-18 ENCOUNTER — Other Ambulatory Visit: Payer: Self-pay

## 2014-08-20 ENCOUNTER — Other Ambulatory Visit: Payer: Self-pay | Admitting: Internal Medicine

## 2014-08-21 ENCOUNTER — Telehealth: Payer: Self-pay | Admitting: Family Medicine

## 2014-08-21 DIAGNOSIS — IMO0002 Reserved for concepts with insufficient information to code with codable children: Secondary | ICD-10-CM

## 2014-08-21 DIAGNOSIS — E1165 Type 2 diabetes mellitus with hyperglycemia: Secondary | ICD-10-CM

## 2014-08-21 NOTE — Telephone Encounter (Signed)
Pt having a1c check on 10/30/14 need an oder placed please

## 2014-08-21 NOTE — Telephone Encounter (Signed)
Lab order entered.

## 2014-09-01 ENCOUNTER — Other Ambulatory Visit: Payer: Self-pay

## 2014-09-01 MED ORDER — METFORMIN HCL 1000 MG PO TABS: ORAL_TABLET | ORAL | Status: DC

## 2014-10-29 ENCOUNTER — Other Ambulatory Visit: Payer: Self-pay | Admitting: *Deleted

## 2014-10-29 MED ORDER — GLIMEPIRIDE 2 MG PO TABS: 2.0000 mg | ORAL_TABLET | Freq: Every morning | ORAL | Status: DC

## 2014-10-30 ENCOUNTER — Ambulatory Visit: Payer: Medicare Other

## 2014-10-30 ENCOUNTER — Other Ambulatory Visit (INDEPENDENT_AMBULATORY_CARE_PROVIDER_SITE_OTHER): Payer: Medicare Other

## 2014-10-30 DIAGNOSIS — Z23 Encounter for immunization: Secondary | ICD-10-CM

## 2014-10-30 DIAGNOSIS — IMO0002 Reserved for concepts with insufficient information to code with codable children: Secondary | ICD-10-CM

## 2014-10-30 DIAGNOSIS — E1165 Type 2 diabetes mellitus with hyperglycemia: Secondary | ICD-10-CM | POA: Diagnosis not present

## 2014-10-30 LAB — HEMOGLOBIN A1C: HEMOGLOBIN A1C: 6.9 % — AB (ref 4.6–6.5)

## 2014-11-28 ENCOUNTER — Telehealth: Payer: Self-pay | Admitting: Family Medicine

## 2014-11-28 MED ORDER — ENALAPRIL MALEATE 20 MG PO TABS: 20.0000 mg | ORAL_TABLET | Freq: Two times a day (BID) | ORAL | Status: DC

## 2014-11-28 NOTE — Telephone Encounter (Signed)
Refill request for Enalapril 20 mg and send to [facility].

## 2014-11-28 NOTE — Telephone Encounter (Signed)
Medication refilled

## 2014-12-15 IMAGING — CR DG KNEE COMPLETE 4+V*R*
4 series · 4 of 4 positions shown · non-contrast
Comparison: None.

CLINICAL DATA: Right knee pain

RIGHT KNEE - COMPLETE 4+ VIEW

[view not recorded (1 of 4)]
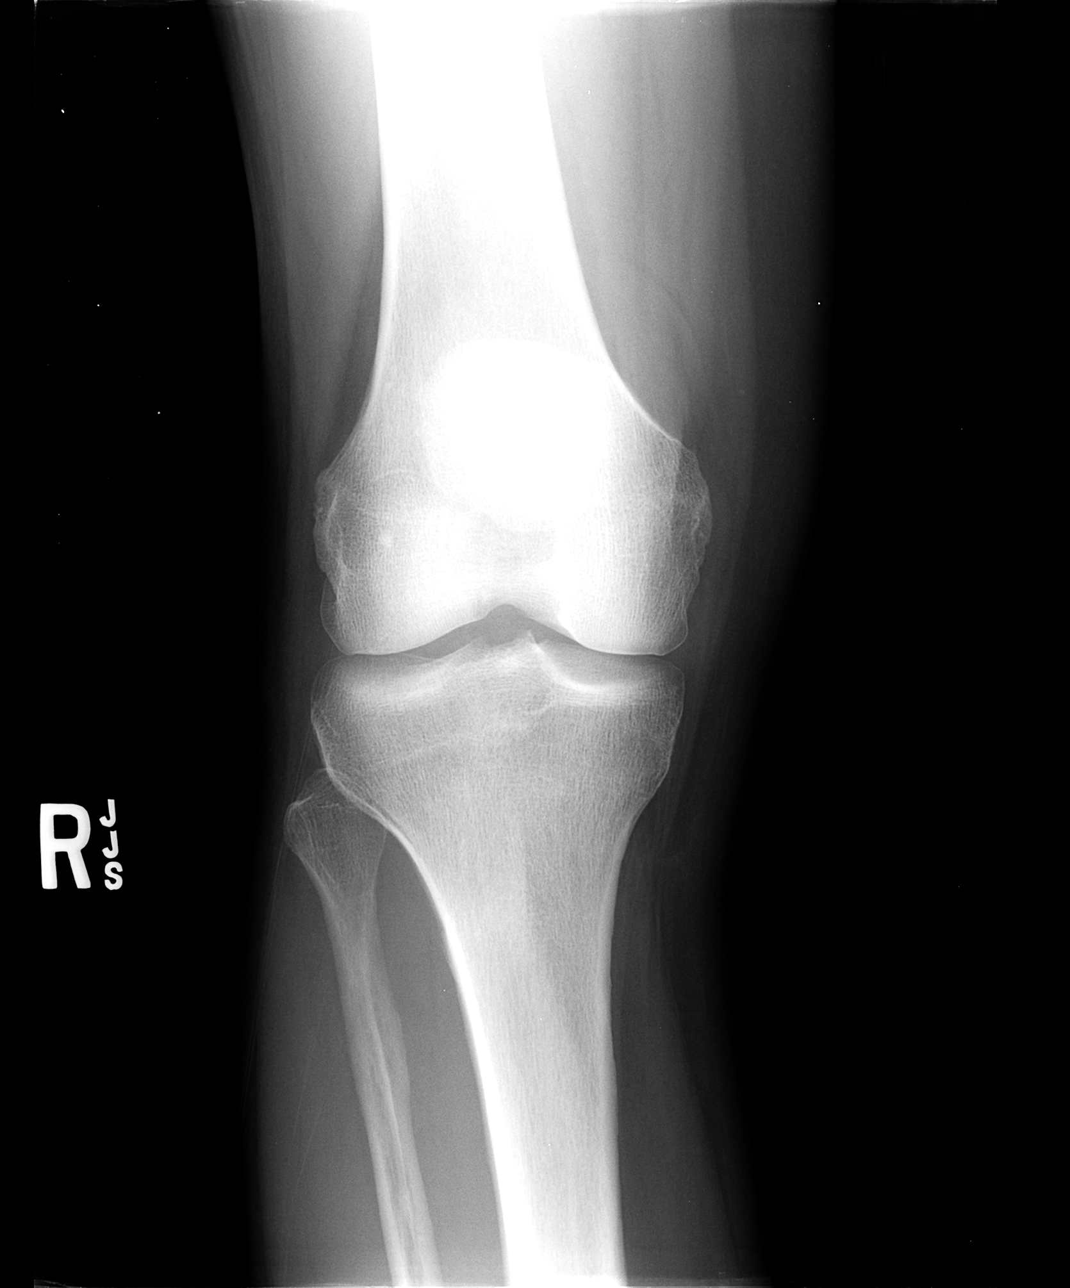

[view not recorded (2 of 4)]
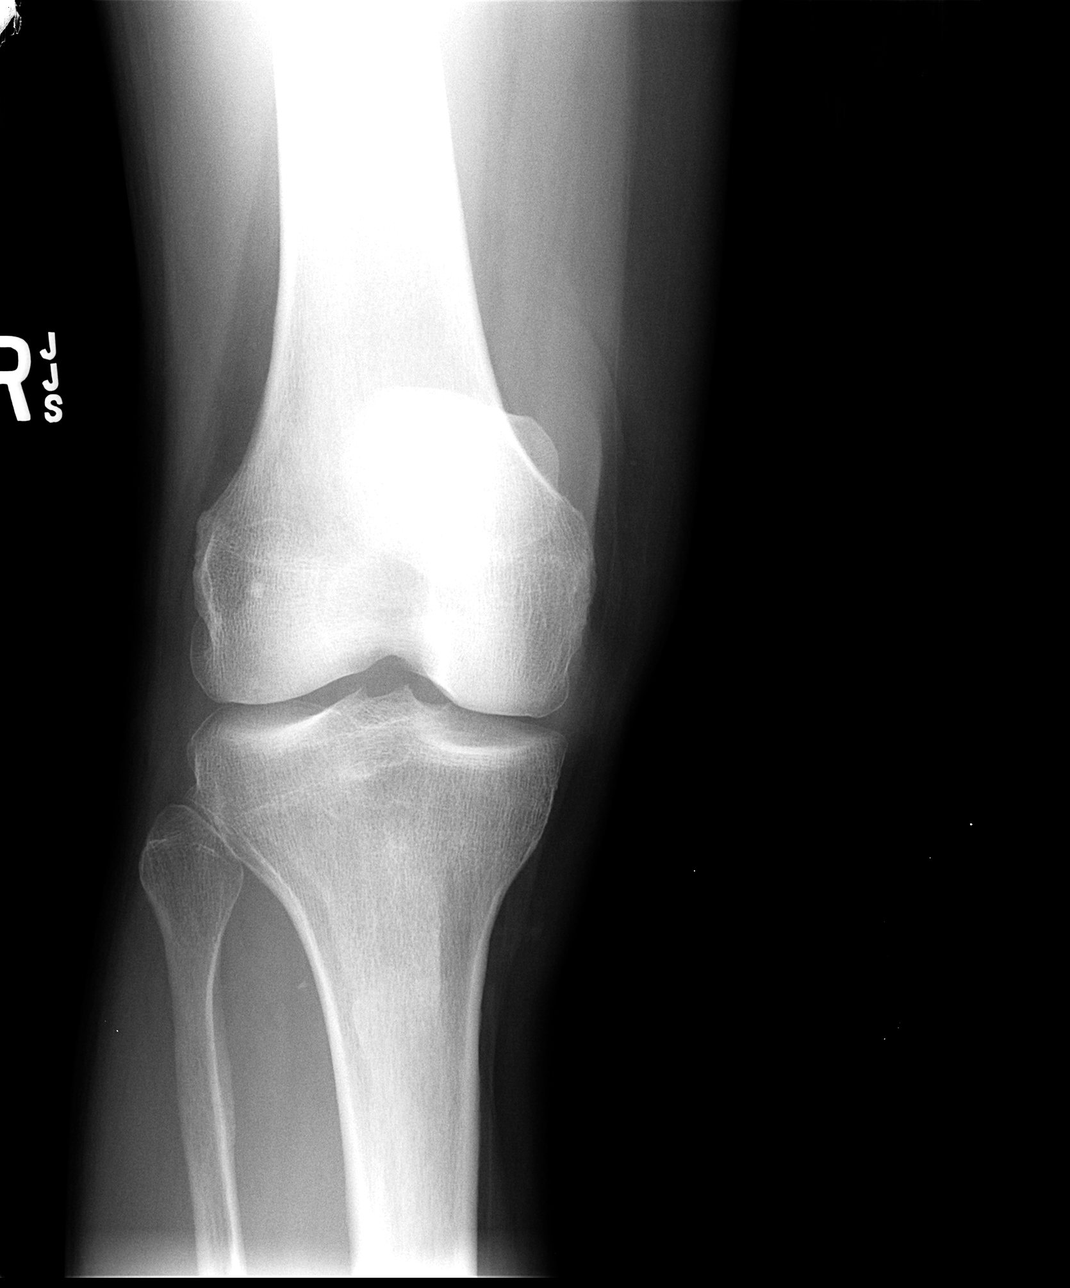

[view not recorded (3 of 4)]
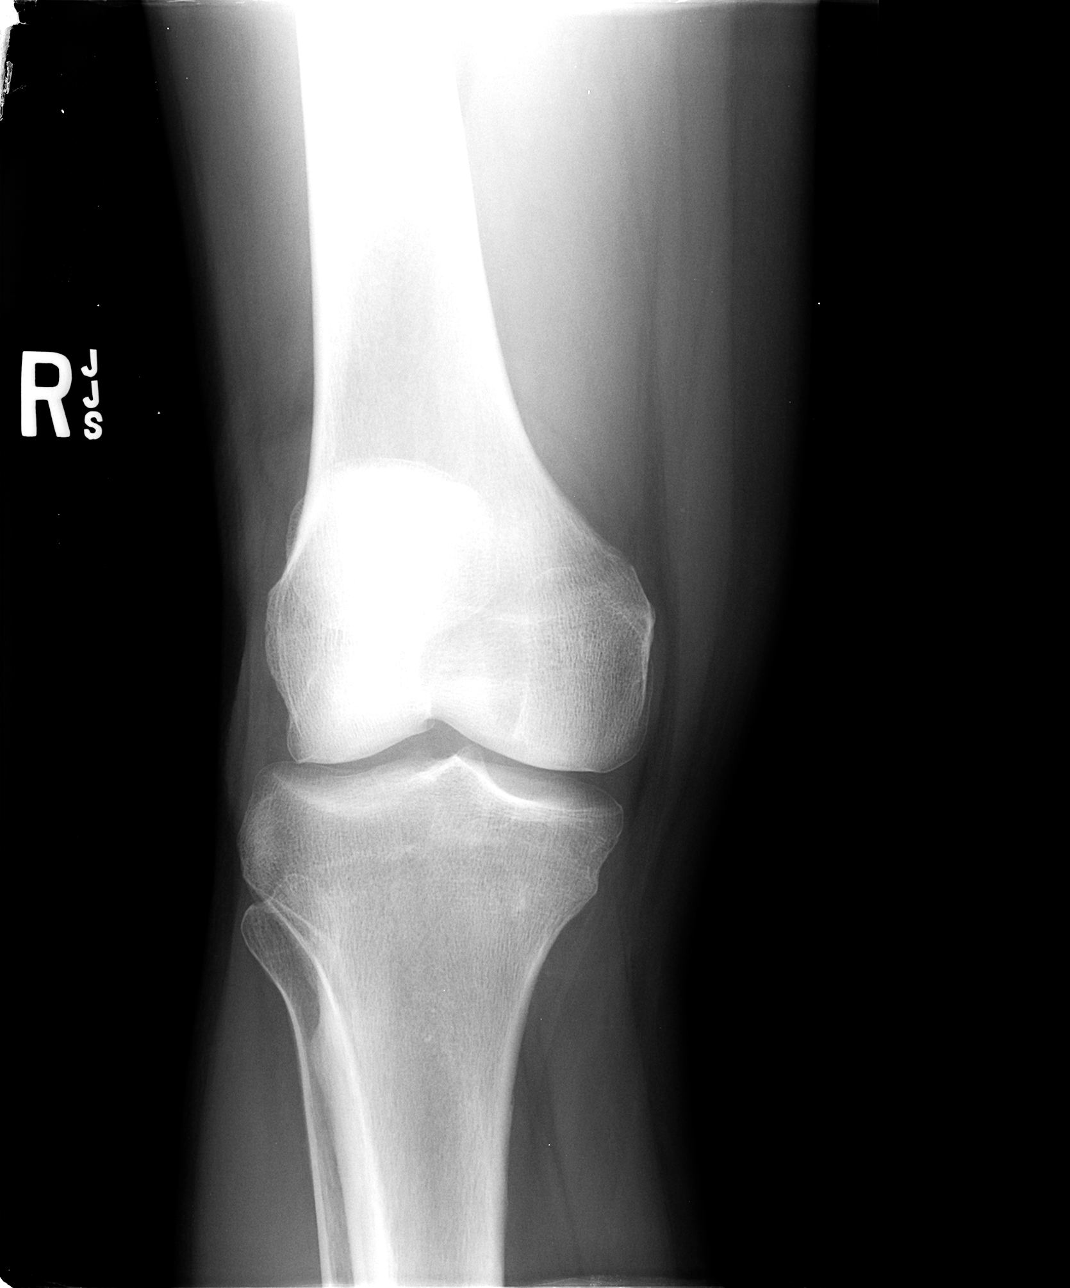

[view not recorded (4 of 4)]
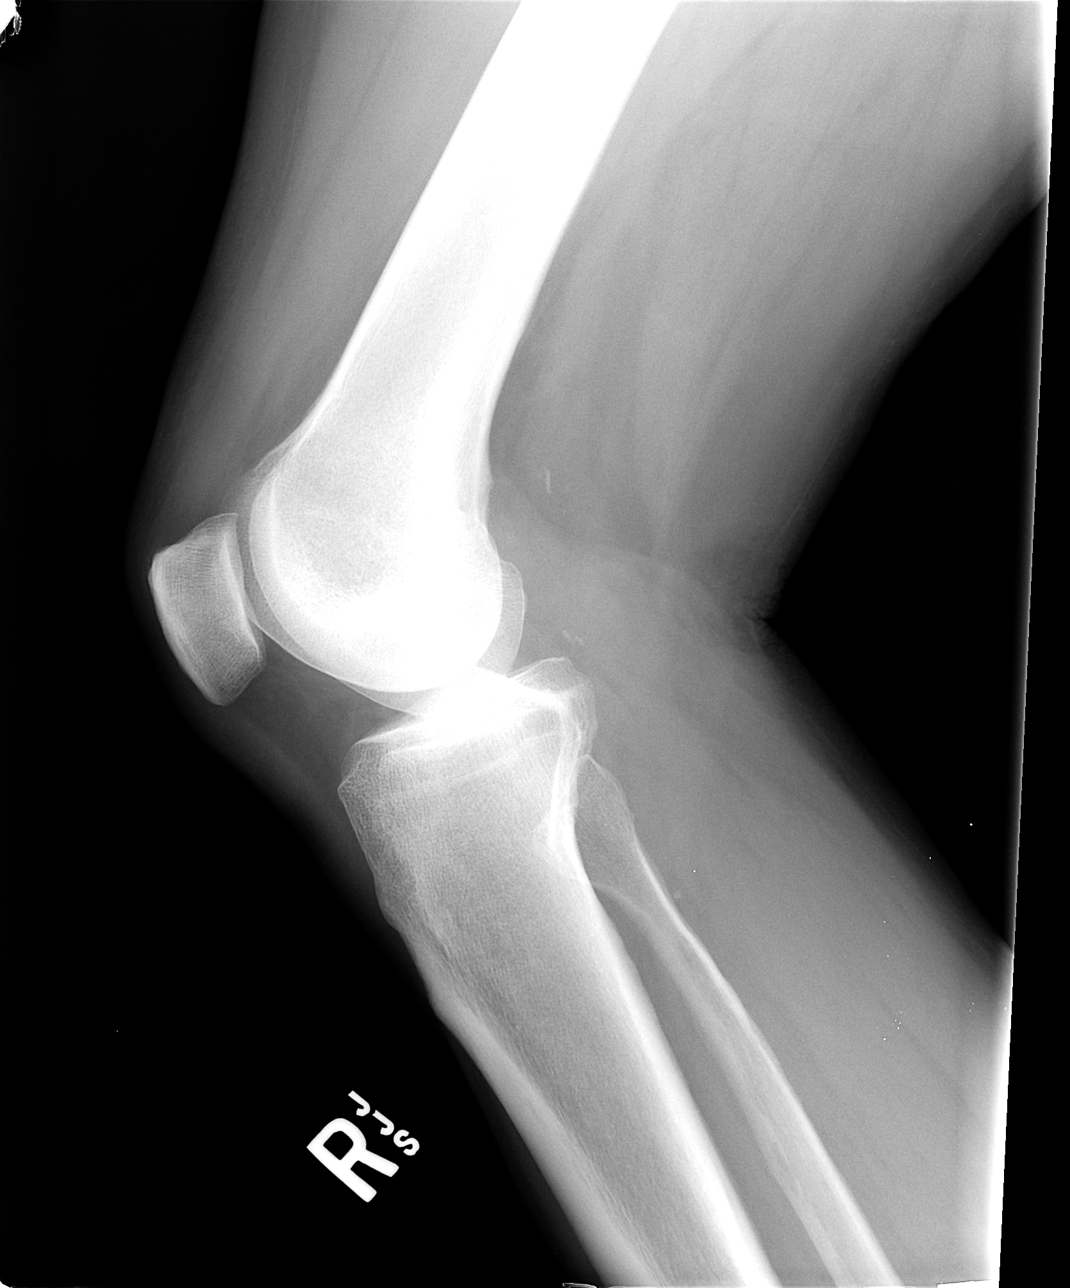

[4 of 4 positions shown; findings below may reference images not displayed]

FINDINGS: Four views of the right knee submitted.  No acute
fracture or subluxation.  Mild narrowing of patellofemoral joint
space.
IMPRESSION: No acute fracture or subluxation.  Mild narrowing of patellofemoral
joint space.

## 2015-01-29 ENCOUNTER — Ambulatory Visit (INDEPENDENT_AMBULATORY_CARE_PROVIDER_SITE_OTHER): Payer: Medicare Other | Admitting: Family Medicine

## 2015-01-29 ENCOUNTER — Encounter: Payer: Self-pay | Admitting: Family Medicine

## 2015-01-29 VITALS — BP 122/70 | Temp 98.6°F | Wt 181.0 lb

## 2015-01-29 DIAGNOSIS — E1165 Type 2 diabetes mellitus with hyperglycemia: Secondary | ICD-10-CM

## 2015-01-29 DIAGNOSIS — L989 Disorder of the skin and subcutaneous tissue, unspecified: Secondary | ICD-10-CM | POA: Diagnosis not present

## 2015-01-29 DIAGNOSIS — I2510 Atherosclerotic heart disease of native coronary artery without angina pectoris: Secondary | ICD-10-CM

## 2015-01-29 DIAGNOSIS — E785 Hyperlipidemia, unspecified: Secondary | ICD-10-CM | POA: Diagnosis not present

## 2015-01-29 DIAGNOSIS — I10 Essential (primary) hypertension: Secondary | ICD-10-CM | POA: Diagnosis not present

## 2015-01-29 DIAGNOSIS — Z20828 Contact with and (suspected) exposure to other viral communicable diseases: Secondary | ICD-10-CM

## 2015-01-29 DIAGNOSIS — IMO0001 Reserved for inherently not codable concepts without codable children: Secondary | ICD-10-CM

## 2015-01-29 NOTE — Progress Notes (Signed)
Benjamin Reddish, MD  Subjective:  Benjamin Browning is a 69 y.o. year old very pleasant male patient who presents for/with See problem oriented charting ROS- No chest pain. No headache or blurry vision. No hypoglycemia  Past Medical History-  Patient Active Problem List   Diagnosis Date Noted  . DNR (do not resuscitate) 12/31/2013    Priority: High  . TOBACCO USE 03/17/2008    Priority: High  . Diabetes mellitus type II, uncontrolled ([place]) 08/21/2006    Priority: High  . CAD (coronary artery disease) 08/21/2006    Priority: High  . Hyperlipidemia 12/31/2013    Priority: Medium  . Essential hypertension 01/22/2007    Priority: Medium  . Malignant neoplasm of bladder ([place]) 08/21/2006    Priority: Medium  . History of CVA (cerebrovascular accident) 08/21/2006    Priority: Medium    Medications- reviewed and updated Current Outpatient Prescriptions  Medication Sig Dispense Refill  . amLODipine (NORVASC) 10 MG tablet Take 1 tablet (10 mg total) by mouth daily. 90 tablet 0  . aspirin 325 MG EC tablet Take 325 mg by mouth daily.     Marland Kitchen atorvastatin (LIPITOR) 20 MG tablet TAKE 1 TABLET DAILY 90 tablet 3  . enalapril (VASOTEC) 20 MG tablet Take 1 tablet (20 mg total) by mouth 2 (two) times daily. 180 tablet 3  . glimepiride (AMARYL) 2 MG tablet Take 1 tablet (2 mg total) by mouth every morning. 90 tablet 1  . metFORMIN (GLUCOPHAGE) 1000 MG tablet TAKE 1 TABLET TWICE A DAY 180 tablet 5  . metoprolol (LOPRESSOR) 50 MG tablet TAKE 1 TABLET TWICE A DAY 180 tablet 0   No current facility-administered medications for this visit.    Objective: BP 122/70 mmHg  Temp(Src) 98.6 F (37 C)  Wt 181 lb (82.101 kg) Gen: NAD, resting comfortably CV: RRR no murmurs rubs or gallops Lungs: CTAB no crackles, wheeze, rhonchi Abdomen: soft/nontender/nondistended/normal bowel sounds. No rebound or guarding.  Ext: no edema Skin: warm, dry Neuro: grossly normal, moves all extremities  Diabetic Foot  Exam - Simple   Simple Foot Form  Diabetic Foot exam was performed with the following findings:  Yes 01/29/2015 10:14 AM  Visual Inspection  No deformities, no ulcerations, no other skin breakdown bilaterally:  Yes  See comments:  Yes  Sensation Testing  Intact to touch and monofilament testing bilaterally:  Yes  Pulse Check  Posterior Tibialis and Dorsalis pulse intact bilaterally:  Yes  Comments  Only concern is on left foot there is a horned/wart like growth. ? Squamous cell cancer-refer to dermatology     Assessment/Plan:  Diabetes mellitus type II, uncontrolled ([place]) S: well controlled. On Metformin 1g BID, amaryl 2g  CBGs- does not check. Denies lows Exercise and diet- little motivation Lab Results  Component Value Date   HGBA1C 6.9* 10/30/2014   HGBA1C 7.3* 12/31/2013   HGBA1C 7.4* 06/27/2013   A/P: check a1c, hopeful a1c 7.5 or less  CAD (coronary artery disease) S: compliant with aspirin and statin. Not interested in following [facility] cardiology. Some shortness of breath and cough (long term smoker) but no progression and no exertional chest pain A/P: continue current threrapy. 2013 exercise stress test. Discuss at follow up if he would be willing to repeat testing. I suspect his interest will be low with DNR status. LDL has been <70 and is at goal on statin  Essential hypertension S: controlled. Enalapril '20mg'$  daily, metoprolol '50mg'$  BID, amlodipine '10mg'$  BP Readings from Last 3 Encounters:  01/29/15 122/70  03/28/14 142/88  01/07/14 140/78  A/P:Continue current meds but check bmet as has had some mild hyperkalemia issues. May need to go down on enalapril dose.   Derm referral- spot on left foot about a cm with horned appearance- could be wart but ? SCC so refer to dermatology for further evaluation. Has been there about 3-4 years and bleeds at times and does seem to be growing some.   Return precautions advised.   Orders Placed This Encounter  Procedures  . CBC     [place]    Standing Status: Future     Number of Occurrences:      Standing Expiration Date: 01/29/2016  . Comprehensive metabolic panel    [place]    Standing Status: Future     Number of Occurrences:      Standing Expiration Date: 01/29/2016  . LDL cholesterol, direct    [place]    Standing Status: Future     Number of Occurrences:      Standing Expiration Date: 01/29/2016  . Hemoglobin A1c    [place]    Standing Status: Future     Number of Occurrences:      Standing Expiration Date: 01/29/2016  . Hepatitis C antibody, reflex    [facility]    Standing Status: Future     Number of Occurrences:      Standing Expiration Date: 01/29/2016  . Ambulatory referral to Dermatology    Referral Priority:  Routine    Referral Type:  Consultation    Referral Reason:  Specialty Services Required    Requested Specialty:  Dermatology    Number of Visits Requested:  1  . POCT urinalysis dipstick    Standing Status: Future     Number of Occurrences:      Standing Expiration Date: 01/29/2016   Health Maintenance Due  Topic Date Due  . Hepatitis C Screening - next week with labs [birth year]  . FOOT EXAM - today . 06/28/2014   Encouraged smoking cessation- patient declines. Currently 1-2 a day cigars Verified continued DNR status 6 month f/u

## 2015-01-29 NOTE — Assessment & Plan Note (Addendum)
S: compliant with aspirin and statin. Not interested in following [facility] cardiology. Some shortness of breath and cough (long term smoker) but no progression and no exertional chest pain A/P: continue current threrapy. 2013 exercise stress test. Discuss at follow up if he would be willing to repeat testing. I suspect his interest will be low with DNR status. LDL has been <70 and is at goal on statin

## 2015-01-29 NOTE — Assessment & Plan Note (Addendum)
S: well controlled. On Metformin 1g BID, amaryl 2g  CBGs- does not check. Denies lows Exercise and diet- little motivation Lab Results  Component Value Date   HGBA1C 6.9* 10/30/2014   HGBA1C 7.3* 12/31/2013   HGBA1C 7.4* 06/27/2013   A/P: check a1c, hopeful a1c 7.5 or less

## 2015-01-29 NOTE — Patient Instructions (Signed)
Medication Instructions:  No changes  Labwork: Schedule a lab visit at the front desk-Return for future fasting labs next week. Nothing but water after midnight please.   Testing/Procedures/Immunizations: Health Maintenance Due  Topic Date Due  . Hepatitis C Screening - with labs next week 12-Jul-1945  . FOOT EXAM - done today 06/28/2014   Follow-Up (all visit scheduling, rescheduling, cancellations including labs should be scheduled at front desk): We will call you within a week about your referral to dermatology for follow up of left foot. If you do not hear within 2 weeks, give [place] a call.   See me back in 6 months ( I usually like to see folks every 6 months with diabetes and blood pressure issues at the latest).   Sooner if you need [place] or if you have new or worsening symptoms

## 2015-01-29 NOTE — Assessment & Plan Note (Signed)
S: controlled. Enalapril '20mg'$  daily, metoprolol '50mg'$  BID, amlodipine '10mg'$  BP Readings from Last 3 Encounters:  01/29/15 122/70  03/28/14 142/88  01/07/14 140/78  A/P:Continue current meds but check bmet as has had some mild hyperkalemia issues. May need to go down on enalapril dose.

## 2015-02-01 ENCOUNTER — Other Ambulatory Visit: Payer: Self-pay | Admitting: Family Medicine

## 2015-02-02 ENCOUNTER — Other Ambulatory Visit: Payer: Self-pay | Admitting: Family Medicine

## 2015-02-06 ENCOUNTER — Other Ambulatory Visit (INDEPENDENT_AMBULATORY_CARE_PROVIDER_SITE_OTHER): Payer: Medicare Other

## 2015-02-06 ENCOUNTER — Other Ambulatory Visit: Payer: Self-pay | Admitting: Family Medicine

## 2015-02-06 DIAGNOSIS — IMO0001 Reserved for inherently not codable concepts without codable children: Secondary | ICD-10-CM

## 2015-02-06 DIAGNOSIS — Z20828 Contact with and (suspected) exposure to other viral communicable diseases: Secondary | ICD-10-CM

## 2015-02-06 DIAGNOSIS — E1165 Type 2 diabetes mellitus with hyperglycemia: Secondary | ICD-10-CM | POA: Diagnosis not present

## 2015-02-06 DIAGNOSIS — I10 Essential (primary) hypertension: Secondary | ICD-10-CM | POA: Diagnosis not present

## 2015-02-06 DIAGNOSIS — E785 Hyperlipidemia, unspecified: Secondary | ICD-10-CM | POA: Diagnosis not present

## 2015-02-06 LAB — COMPREHENSIVE METABOLIC PANEL
ALBUMIN: 4.5 g/dL (ref 3.5–5.2)
ALT: 16 U/L (ref 0–53)
AST: 16 U/L (ref 0–37)
Alkaline Phosphatase: 76 U/L (ref 39–117)
BUN: 12 mg/dL (ref 6–23)
CHLORIDE: 104 meq/L (ref 96–112)
CO2: 22 meq/L (ref 19–32)
CREATININE: 1.17 mg/dL (ref 0.40–1.50)
Calcium: 10 mg/dL (ref 8.4–10.5)
GFR: 65.4 mL/min (ref >60.00)
GLUCOSE: 126 mg/dL — AB (ref 70–99)
Potassium: 4.2 mEq/L (ref 3.5–5.1)
SODIUM: 139 meq/L (ref 135–145)
Total Bilirubin: 0.4 mg/dL (ref 0.2–1.2)
Total Protein: 8 g/dL (ref 6.0–8.3)

## 2015-02-06 LAB — LDL CHOLESTEROL, DIRECT: LDL DIRECT: 66 mg/dL

## 2015-02-06 LAB — POCT URINALYSIS DIPSTICK
Bilirubin, UA: NEGATIVE
Glucose, UA: NEGATIVE
Ketones, UA: NEGATIVE
Leukocytes, UA: NEGATIVE
NITRITE UA: NEGATIVE
PH UA: 5.5
Protein, UA: NEGATIVE
RBC UA: NEGATIVE
UROBILINOGEN UA: 0.2

## 2015-02-06 LAB — CBC
HCT: 44.7 % (ref 39.0–52.0)
Hemoglobin: 14.8 g/dL (ref 13.0–17.0)
MCHC: 33.1 g/dL (ref 30.0–36.0)
MCV: 88.5 fl (ref 78.0–100.0)
Platelets: 332 10*3/uL (ref 150.0–400.0)
RBC: 5.06 Mil/uL (ref 4.22–5.81)
RDW: 13.6 % (ref 11.5–15.5)
WBC: 13 10*3/uL — ABNORMAL HIGH (ref 4.0–10.5)

## 2015-02-06 LAB — HEMOGLOBIN A1C: Hgb A1c MFr Bld: 6.8 % — ABNORMAL HIGH (ref 4.6–6.5)

## 2015-02-07 LAB — HEPATITIS C ANTIBODY: HCV AB: NEGATIVE

## 2015-03-09 DIAGNOSIS — C44729 Squamous cell carcinoma of skin of left lower limb, including hip: Secondary | ICD-10-CM | POA: Diagnosis not present

## 2015-03-09 DIAGNOSIS — D0472 Carcinoma in situ of skin of left lower limb, including hip: Secondary | ICD-10-CM | POA: Diagnosis not present

## 2015-03-09 DIAGNOSIS — L821 Other seborrheic keratosis: Secondary | ICD-10-CM | POA: Diagnosis not present

## 2015-03-20 DIAGNOSIS — D0472 Carcinoma in situ of skin of left lower limb, including hip: Secondary | ICD-10-CM | POA: Diagnosis not present

## 2015-04-13 ENCOUNTER — Other Ambulatory Visit: Payer: Self-pay | Admitting: Family Medicine

## 2015-04-15 ENCOUNTER — Other Ambulatory Visit: Payer: Self-pay | Admitting: Family Medicine

## 2015-04-18 ENCOUNTER — Other Ambulatory Visit: Payer: Self-pay | Admitting: Internal Medicine

## 2015-04-18 ENCOUNTER — Other Ambulatory Visit: Payer: Self-pay | Admitting: Family Medicine

## 2015-05-01 DIAGNOSIS — Z85828 Personal history of other malignant neoplasm of skin: Secondary | ICD-10-CM | POA: Diagnosis not present

## 2015-07-28 ENCOUNTER — Ambulatory Visit (INDEPENDENT_AMBULATORY_CARE_PROVIDER_SITE_OTHER): Payer: Medicare Other | Admitting: Family Medicine

## 2015-07-28 ENCOUNTER — Encounter: Payer: Self-pay | Admitting: Family Medicine

## 2015-07-28 VITALS — BP 122/82 | HR 60 | Temp 98.5°F | Ht 67.0 in | Wt 176.0 lb

## 2015-07-28 DIAGNOSIS — E785 Hyperlipidemia, unspecified: Secondary | ICD-10-CM | POA: Diagnosis not present

## 2015-07-28 DIAGNOSIS — I2510 Atherosclerotic heart disease of native coronary artery without angina pectoris: Secondary | ICD-10-CM | POA: Diagnosis not present

## 2015-07-28 DIAGNOSIS — E119 Type 2 diabetes mellitus without complications: Secondary | ICD-10-CM

## 2015-07-28 DIAGNOSIS — I10 Essential (primary) hypertension: Secondary | ICD-10-CM | POA: Diagnosis not present

## 2015-07-28 DIAGNOSIS — C679 Malignant neoplasm of bladder, unspecified: Secondary | ICD-10-CM

## 2015-07-28 LAB — CBC WITH DIFFERENTIAL/PLATELET
BASOS PCT: 0.3 % (ref 0.0–3.0)
Basophils Absolute: 0 10*3/uL (ref 0.0–0.1)
EOS ABS: 0.1 10*3/uL (ref 0.0–0.7)
EOS PCT: 0.7 % (ref 0.0–5.0)
HEMATOCRIT: 44.3 % (ref 39.0–52.0)
HEMOGLOBIN: 14.6 g/dL (ref 13.0–17.0)
LYMPHS PCT: 28.9 % (ref 12.0–46.0)
Lymphs Abs: 3.8 10*3/uL (ref 0.7–4.0)
MCHC: 33 g/dL (ref 30.0–36.0)
MCV: 89.2 fl (ref 78.0–100.0)
MONOS PCT: 7.3 % (ref 3.0–12.0)
Monocytes Absolute: 0.9 10*3/uL (ref 0.1–1.0)
NEUTROS ABS: 8.2 10*3/uL — AB (ref 1.4–7.7)
Neutrophils Relative %: 62.8 % (ref 43.0–77.0)
PLATELETS: 337 10*3/uL (ref 150.0–400.0)
RBC: 4.97 Mil/uL (ref 4.22–5.81)
RDW: 13.5 % (ref 11.5–15.5)
WBC: 13 10*3/uL — AB (ref 4.0–10.5)

## 2015-07-28 LAB — COMPREHENSIVE METABOLIC PANEL
ALBUMIN: 4.7 g/dL (ref 3.5–5.2)
ALT: 17 U/L (ref 0–53)
AST: 16 U/L (ref 0–37)
Alkaline Phosphatase: 70 U/L (ref 39–117)
BILIRUBIN TOTAL: 0.5 mg/dL (ref 0.2–1.2)
BUN: 16 mg/dL (ref 6–23)
CHLORIDE: 101 meq/L (ref 96–112)
CO2: 28 mEq/L (ref 19–32)
CREATININE: 1.21 mg/dL (ref 0.40–1.50)
Calcium: 10.3 mg/dL (ref 8.4–10.5)
GFR: 62.83 mL/min (ref >60.00)
Glucose, Bld: 108 mg/dL — ABNORMAL HIGH (ref 70–99)
Potassium: 5.2 mEq/L — ABNORMAL HIGH (ref 3.5–5.1)
Sodium: 138 mEq/L (ref 135–145)
Total Protein: 7.8 g/dL (ref 6.0–8.3)

## 2015-07-28 LAB — LIPID PANEL
CHOLESTEROL: 145 mg/dL (ref 0–200)
HDL: 39.3 mg/dL (ref >39.00)
NonHDL: 106.12
Total CHOL/HDL Ratio: 4
Triglycerides: 203 mg/dL — ABNORMAL HIGH (ref 0.0–149.0)
VLDL: 40.6 mg/dL — AB (ref 0.0–40.0)

## 2015-07-28 LAB — LDL CHOLESTEROL, DIRECT: Direct LDL: 79 mg/dL

## 2015-07-28 LAB — HEMOGLOBIN A1C: HEMOGLOBIN A1C: 6.7 % — AB (ref 4.6–6.5)

## 2015-07-28 NOTE — Progress Notes (Signed)
Subjective:  Benjamin Browning is a 70 y.o. year old very pleasant male patient who presents for/with See problem oriented charting ROS- no hypoglycemia. No chest pain. No headache or blurry vision.see any ROS included in HPI as well.   Past Medical History-  Patient Active Problem List   Diagnosis Date Noted  . DNR (do not resuscitate) 12/31/2013    Priority: High  . TOBACCO USE 03/17/2008    Priority: High  . Diabetes mellitus type II, controlled ([place]) 08/21/2006    Priority: High  . CAD (coronary artery disease) 08/21/2006    Priority: High  . Hyperlipidemia 12/31/2013    Priority: Medium  . Essential hypertension 01/22/2007    Priority: Medium  . Malignant neoplasm of bladder ([place]) 08/21/2006    Priority: Medium  . History of CVA (cerebrovascular accident) 08/21/2006    Priority: Medium    Medications- reviewed and updated Current Outpatient Prescriptions  Medication Sig Dispense Refill  . amLODipine (NORVASC) 10 MG tablet Take 1 tablet (10 mg total) by mouth daily. 90 tablet 0  . aspirin 325 MG EC tablet Take 325 mg by mouth daily.     Marland Kitchen atorvastatin (LIPITOR) 20 MG tablet TAKE 1 TABLET DAILY 90 tablet 3  . enalapril (VASOTEC) 20 MG tablet Take 1 tablet (20 mg total) by mouth 2 (two) times daily. 180 tablet 3  . glimepiride (AMARYL) 2 MG tablet Take 1 tablet (2 mg total) by mouth every morning. 90 tablet 1  . metFORMIN (GLUCOPHAGE) 1000 MG tablet TAKE 1 TABLET TWICE A DAY 180 tablet 5  . metoprolol (LOPRESSOR) 50 MG tablet TAKE 1 TABLET TWICE A DAY 180 tablet 3   No current facility-administered medications for this visit.    Objective: BP 122/82 mmHg  Pulse 60  Temp(Src) 98.5 F (36.9 C) (Oral)  Ht '5\' 7"'$  (1.702 m)  Wt 176 lb (79.833 kg)  BMI 27.56 kg/m2 Gen: NAD, resting comfortably, smells of smoke CV: RRR no murmurs rubs or gallops Lungs: CTAB no crackles, wheeze, rhonchi  Ext: no edema Skin: warm, dry Neuro: grossly normal, moves all  extremities  Assessment/Plan:  Diabetes mellitus type II, controlled ([place]) S: previously well controlled. On metformin 1g BID, amaryl 2g CBGs- no checks, no lows Exercise and diet- states low motivation although has lost 5 lbs Lab Results  Component Value Date   HGBA1C 6.8* 02/06/2015   HGBA1C 6.9* 10/30/2014   HGBA1C 7.3* 12/31/2013   A/P: update a1c today. Has appt end of June with optho- given my card and asked them to send copy to [place]  CAD (coronary artery disease) S: continues on aspirin and statin- still not interested in following up with cardiology. Baseline SOB but no worsening and no exertional chest pain A/P: discussed option of repeating stress test. Patient is not interested in this or quitting smoking. He states that if he is going to go , he is going to go. He feels that 56 is a long full life. On other hand, does not want to stop medicines currently taking.   Essential hypertension S: controlled. On enalapril '20mg'$ , metoprolol '50mg'$  BID, amlodipine '10mg'$   BP Readings from Last 3 Encounters:  07/28/15 122/82  01/29/15 122/70  03/28/14 142/88  A/P:Continue current meds:  Check potassium as has had mild elevations in past  Hyperlipidemia S: well controlled on atorvastatin '20mg'$ . No myalgias.  Lab Results  Component Value Date   CHOL 131 03/28/2014   HDL 40.40 03/28/2014   LDLCALC 51 03/28/2014  LDLDIRECT 66.0 02/06/2015   TRIG 199.0* 03/28/2014   CHOLHDL 3 03/28/2014   A/P: update fasting lipids today  Malignant neoplasm of bladder  Inform patient of my preference to get UA. Patient declines UA and states this issue "is in my past".  Verbally advised 6 month follow up. Return precautions advised.   Orders Placed This Encounter  Procedures  . Lipid panel    [place]    Order Specific Question:  Has the patient fasted?    Answer:  No  . CBC with Differential/Platelet  . Comprehensive metabolic panel    [place]    Order Specific Question:  Has the patient  fasted?    Answer:  No  . Hemoglobin A1c    [place]   Garret Reddish, MD

## 2015-07-28 NOTE — Assessment & Plan Note (Signed)
S: controlled. On enalapril '20mg'$ , metoprolol '50mg'$  BID, amlodipine '10mg'$   BP Readings from Last 3 Encounters:  07/28/15 122/82  01/29/15 122/70  03/28/14 142/88  A/P:Continue current meds:  Check potassium as has had mild elevations in past

## 2015-07-28 NOTE — Assessment & Plan Note (Signed)
S: well controlled on atorvastatin '20mg'$ . No myalgias.  Lab Results  Component Value Date   CHOL 131 03/28/2014   HDL 40.40 03/28/2014   LDLCALC 51 03/28/2014   LDLDIRECT 66.0 02/06/2015   TRIG 199.0* 03/28/2014   CHOLHDL 3 03/28/2014   A/P: update fasting lipids today

## 2015-07-28 NOTE — Assessment & Plan Note (Signed)
Inform patient of my preference to get UA. Patient declines UA and states this issue "is in my past".

## 2015-07-28 NOTE — Patient Instructions (Addendum)
No changes today unless labs lead [place] to make changes  I think you are doing well  Have them send [place] a copy of your eye exam

## 2015-07-28 NOTE — Assessment & Plan Note (Signed)
S: continues on aspirin and statin- still not interested in following up with cardiology. Baseline SOB but no worsening and no exertional chest pain A/P: discussed option of repeating stress test. Patient is not interested in this or quitting smoking. He states that if he is going to go , he is going to go. He feels that 73 is a long full life. On other hand, does not want to stop medicines currently taking.

## 2015-07-28 NOTE — Assessment & Plan Note (Signed)
S: previously well controlled. On metformin 1g BID, amaryl 2g CBGs- no checks, no lows Exercise and diet- states low motivation although has lost 5 lbs Lab Results  Component Value Date   HGBA1C 6.8* 02/06/2015   HGBA1C 6.9* 10/30/2014   HGBA1C 7.3* 12/31/2013   A/P: update a1c today. Has appt end of June with optho- given my card and asked them to send copy to [place]

## 2015-08-14 ENCOUNTER — Other Ambulatory Visit: Payer: Self-pay | Admitting: Internal Medicine

## 2015-08-20 DIAGNOSIS — H2513 Age-related nuclear cataract, bilateral: Secondary | ICD-10-CM | POA: Diagnosis not present

## 2015-08-20 DIAGNOSIS — E119 Type 2 diabetes mellitus without complications: Secondary | ICD-10-CM | POA: Diagnosis not present

## 2015-08-20 LAB — HM DIABETES EYE EXAM

## 2015-08-31 ENCOUNTER — Other Ambulatory Visit: Payer: Self-pay | Admitting: Family Medicine

## 2015-11-02 DIAGNOSIS — L905 Scar conditions and fibrosis of skin: Secondary | ICD-10-CM | POA: Diagnosis not present

## 2015-11-02 DIAGNOSIS — L821 Other seborrheic keratosis: Secondary | ICD-10-CM | POA: Diagnosis not present

## 2016-01-17 ENCOUNTER — Other Ambulatory Visit: Payer: Self-pay | Admitting: Family Medicine

## 2016-01-28 ENCOUNTER — Other Ambulatory Visit: Payer: Self-pay

## 2016-01-28 ENCOUNTER — Encounter: Payer: Self-pay | Admitting: Family Medicine

## 2016-01-28 ENCOUNTER — Ambulatory Visit (INDEPENDENT_AMBULATORY_CARE_PROVIDER_SITE_OTHER): Payer: Medicare Other | Admitting: Family Medicine

## 2016-01-28 ENCOUNTER — Ambulatory Visit (INDEPENDENT_AMBULATORY_CARE_PROVIDER_SITE_OTHER)
Admission: RE | Admit: 2016-01-28 | Discharge: 2016-01-28 | Disposition: A | Discharge status: Home / Self Care | Payer: Medicare Other | Source: Ambulatory Visit | Attending: Family Medicine | Admitting: Family Medicine

## 2016-01-28 VITALS — BP 128/78 | HR 72 | Temp 98.2°F | Ht 67.0 in | Wt 178.6 lb

## 2016-01-28 DIAGNOSIS — G8929 Other chronic pain: Secondary | ICD-10-CM

## 2016-01-28 DIAGNOSIS — F17200 Nicotine dependence, unspecified, uncomplicated: Secondary | ICD-10-CM

## 2016-01-28 DIAGNOSIS — M25562 Pain in left knee: Secondary | ICD-10-CM

## 2016-01-28 DIAGNOSIS — M25561 Pain in right knee: Secondary | ICD-10-CM

## 2016-01-28 DIAGNOSIS — M170 Bilateral primary osteoarthritis of knee: Secondary | ICD-10-CM | POA: Diagnosis not present

## 2016-01-28 DIAGNOSIS — Z23 Encounter for immunization: Secondary | ICD-10-CM

## 2016-01-28 DIAGNOSIS — I2510 Atherosclerotic heart disease of native coronary artery without angina pectoris: Secondary | ICD-10-CM | POA: Diagnosis not present

## 2016-01-28 DIAGNOSIS — E119 Type 2 diabetes mellitus without complications: Secondary | ICD-10-CM

## 2016-01-28 DIAGNOSIS — I10 Essential (primary) hypertension: Secondary | ICD-10-CM

## 2016-01-28 LAB — CBC WITH DIFFERENTIAL/PLATELET
Basophils Absolute: 0 10*3/uL (ref 0.0–0.1)
Basophils Relative: 0.2 % (ref 0.0–3.0)
Eosinophils Absolute: 0.1 10*3/uL (ref 0.0–0.7)
Eosinophils Relative: 1.2 % (ref 0.0–5.0)
HEMATOCRIT: 44.9 % (ref 39.0–52.0)
HEMOGLOBIN: 15 g/dL (ref 13.0–17.0)
Lymphocytes Relative: 31.3 % (ref 12.0–46.0)
Lymphs Abs: 3.7 10*3/uL (ref 0.7–4.0)
MCHC: 33.5 g/dL (ref 30.0–36.0)
MCV: 88.1 fl (ref 78.0–100.0)
MONO ABS: 0.7 10*3/uL (ref 0.1–1.0)
Monocytes Relative: 5.8 % (ref 3.0–12.0)
Neutro Abs: 7.2 10*3/uL (ref 1.4–7.7)
Neutrophils Relative %: 61.5 % (ref 43.0–77.0)
Platelets: 279 10*3/uL (ref 150.0–400.0)
RBC: 5.09 Mil/uL (ref 4.22–5.81)
RDW: 13.7 % (ref 11.5–15.5)
WBC: 11.7 10*3/uL — AB (ref 4.0–10.5)

## 2016-01-28 LAB — COMPREHENSIVE METABOLIC PANEL
ALBUMIN: 4.8 g/dL (ref 3.5–5.2)
ALT: 20 U/L (ref 0–53)
AST: 18 U/L (ref 0–37)
Alkaline Phosphatase: 75 U/L (ref 39–117)
BUN: 18 mg/dL (ref 6–23)
CALCIUM: 10 mg/dL (ref 8.4–10.5)
CHLORIDE: 103 meq/L (ref 96–112)
CO2: 23 mEq/L (ref 19–32)
Creatinine, Ser: 1.09 mg/dL (ref 0.40–1.50)
GFR: 70.77 mL/min (ref >60.00)
Glucose, Bld: 121 mg/dL — ABNORMAL HIGH (ref 70–99)
POTASSIUM: 4.7 meq/L (ref 3.5–5.1)
SODIUM: 138 meq/L (ref 135–145)
Total Bilirubin: 0.5 mg/dL (ref 0.2–1.2)
Total Protein: 8.1 g/dL (ref 6.0–8.3)

## 2016-01-28 LAB — LDL CHOLESTEROL, DIRECT: LDL DIRECT: 66 mg/dL

## 2016-01-28 LAB — HEMOGLOBIN A1C: HEMOGLOBIN A1C: 7 % — AB (ref 4.6–6.5)

## 2016-01-28 NOTE — Progress Notes (Signed)
Subjective:  Benjamin Browning is a 70 y.o. year old very pleasant male patient who presents for/with See problem oriented charting ROS- No chest pain or shortness of breath.  No headache or blurry vision.    Past Medical History-  Patient Active Problem List   Diagnosis Date Noted  . DNR (do not resuscitate) 12/31/2013    Priority: High  . TOBACCO USE 03/17/2008    Priority: High  . Diabetes mellitus type II, controlled ([place]) 08/21/2006    Priority: High  . CAD (coronary artery disease) 08/21/2006    Priority: High  . Hyperlipidemia 12/31/2013    Priority: Medium  . Essential hypertension 01/22/2007    Priority: Medium  . Malignant neoplasm of bladder ([place]) 08/21/2006    Priority: Medium  . History of CVA (cerebrovascular accident) 08/21/2006    Priority: Medium  . Bilateral knee pain 07/26/2012    Medications- reviewed and updated Current Outpatient Prescriptions  Medication Sig Dispense Refill  . amLODipine (NORVASC) 10 MG tablet Take 1 tablet (10 mg total) by mouth daily. 90 tablet 0  . aspirin 325 MG EC tablet Take 325 mg by mouth daily.     Marland Kitchen atorvastatin (LIPITOR) 20 MG tablet TAKE 1 TABLET DAILY 90 tablet 3  . enalapril (VASOTEC) 20 MG tablet Take 1 tablet (20 mg total) by mouth 2 (two) times daily. 180 tablet 3  . glimepiride (AMARYL) 2 MG tablet Take 1 tablet (2 mg total) by mouth every morning. 90 tablet 1  . metFORMIN (GLUCOPHAGE) 1000 MG tablet TAKE 1 TABLET TWICE A DAY 180 tablet 3  . metoprolol (LOPRESSOR) 50 MG tablet TAKE 1 TABLET TWICE A DAY 180 tablet 3   No current facility-administered medications for this visit.     Objective: BP 128/78 (BP Location: Left Arm, Patient Position: Sitting, Cuff Size: Large)   Pulse 72   Temp 98.2 F (36.8 C) (Oral)   Ht '5\' 7"'$  (1.702 m)   Wt 178 lb 9.6 oz (81 kg)   SpO2 96%   BMI 27.97 kg/m  Gen: NAD, resting comfortably, smells of smoke CV: RRR no murmurs rubs or gallops Lungs: CTAB no crackles, wheeze,  rhonchi Abdomen: soft/nontender/nondistended/normal bowel sounds. obese Ext: no edema Skin: warm, dry  Assessment/Plan:  Diabetes mellitus type II, controlled ([place]) S: well controlled. On metformin 1g BID and amaryl '2mg'$  last visit. Agreed on goal a1c 7.5.  CBGs- doesn't check sugars Exercise and diet- limited by knee and hip pain. Feels like eating less but weight stable.  Lab Results  Component Value Date   HGBA1C 6.7 (H) 07/28/2015   HGBA1C 6.8 (H) 02/06/2015   HGBA1C 6.9 (H) 10/30/2014   A/P: update a1c, get copy of eye exam- ROI. Get CBC and CMP as well- last wbc elevated some.   TOBACCO USE 1 cigar a day. Enjoys it- does not want to quit. He is DNR- prolonging his life is not top priority- quality is for him  CAD (coronary artery disease) S: patient compliant with aspirin and atorvastatin. Not seeing cardiology. LDL slightly above goal at 79 on atorvastatin '20mg'$  A/P: discussed follow up with cardiology for updating stress test- patient declines. Continue statin and aspirin- Also update direct LDL, hopeful under 70 at this point  Essential hypertension S: controlled on enalapril '20mg'$ , metoprolol 50 BID, amlodipine '10mg'$  even per new guidlines BP Readings from Last 3 Encounters:  01/28/16 128/78  07/28/15 122/82  01/29/15 122/70  A/P:Continue current medications  Malignant neoplasm of bladder ([facility]) Declines  UA- aware of bladder cancer risk and association with smoking  Bilateral knee pain Pain in bilateral knees and hips- saw [facility] in past. Discussed x-rays for potential OA- he agrees to this. Consider injections at follow up. Would avoid nsaids with his CAD history  See me in 6 months as long as a1c under 7  Orders Placed This Encounter  Procedures  . DG Knee Bilateral Standing AP    Standing Status:   Future    Standing Expiration Date:   03/30/2017    Order Specific Question:   Reason for Exam (SYMPTOM  OR DIAGNOSIS REQUIRED)    Answer:   bilateral knee pain     Order Specific Question:   Preferred imaging location?    Answer:   [facility]  . Flu vaccine HIGH DOSE PF  . CBC with Differential/Platelet  . Comprehensive metabolic panel    [name]  . LDL cholesterol, direct    [place]  . Hemoglobin A1c       Return precautions advised.  Garret Reddish, MD

## 2016-01-28 NOTE — Progress Notes (Signed)
Pre visit review using our clinic review tool, if applicable. No additional management support is needed unless otherwise documented below in the visit note. 

## 2016-01-28 NOTE — Assessment & Plan Note (Addendum)
S: well controlled. On metformin 1g BID and amaryl '2mg'$  last visit. Agreed on goal a1c 7.5.  CBGs- doesn't check sugars Exercise and diet- limited by knee and hip pain. Feels like eating less but weight stable.  Lab Results  Component Value Date   HGBA1C 6.7 (H) 07/28/2015   HGBA1C 6.8 (H) 02/06/2015   HGBA1C 6.9 (H) 10/30/2014   A/P: update a1c, get copy of eye exam- ROI. Get CBC and CMP as well- last wbc elevated some.

## 2016-01-28 NOTE — Assessment & Plan Note (Signed)
Declines UA- aware of bladder cancer risk and association with smoking

## 2016-01-28 NOTE — Assessment & Plan Note (Addendum)
Pain in bilateral knees and hips- saw [facility] in past. Discussed x-rays for potential OA- he agrees to this. Consider injections at follow up. Would avoid nsaids with his CAD history

## 2016-01-28 NOTE — Assessment & Plan Note (Signed)
S: controlled on enalapril '20mg'$ , metoprolol 50 BID, amlodipine '10mg'$  even per new guidlines BP Readings from Last 3 Encounters:  01/28/16 128/78  07/28/15 122/82  01/29/15 122/70  A/P:Continue current medications

## 2016-01-28 NOTE — Patient Instructions (Addendum)
Sign release of information at the check out desk for eye exam from June  Labs before you leave  See me in 6 months as long as a1c under 7

## 2016-01-28 NOTE — Assessment & Plan Note (Signed)
S: patient compliant with aspirin and atorvastatin. Not seeing cardiology. LDL slightly above goal at 79 on atorvastatin '20mg'$  A/P: discussed follow up with cardiology for updating stress test- patient declines. Continue statin and aspirin- Also update direct LDL, hopeful under 70 at this point

## 2016-01-28 NOTE — Assessment & Plan Note (Signed)
1 cigar a day. Enjoys it- does not want to quit. He is DNR- prolonging his life is not top priority- quality is for him

## 2016-01-31 ENCOUNTER — Other Ambulatory Visit: Payer: Self-pay | Admitting: Family Medicine

## 2016-02-08 ENCOUNTER — Encounter: Payer: Self-pay | Admitting: Family Medicine

## 2016-04-06 NOTE — Progress Notes (Signed)
Subjective:   Benjamin Browning is a 71 y.o. male who presents for Medicare Annual/Subsequent preventive examination.  HRA assessment completed during this visit with Benjamin Browning  The Patient was informed that the wellness visit is to identify future health risk and educate and initiate measures that can reduce risk for increased disease through the lifespan.    NO ROS; Medicare Wellness Visit Last OV:  Dr. Yong Channel 01/2216 Labs completed: 01/2016;     Describes health as fair, good or great?  Came in today, but not sure why he is here; He is 80 and the days of worrying about his "wellness" are over.   Admits health is not as good as it was  Voices distress due to pain in knees; states he was not happy with ortho. There was discussion of hip pain but he feels his pain is in his knees. Discussed sports medicine and he would consider this if Dr. Yong Channel would refer.  Voices fear of alzheimer's Disease; both his in-laws had this. Voices concerne about his recall but declined memory test. "doesn't feel that is helpful". Did ask me to take his BP before he left with no recall that I had taken it. When I shared  I was planning to repeat because it was high he was surprised I had taken it.   Continues to smoke one cigar a day. Is not going to quit "so don't talk about it". Does have deep  cough" this am throughout the assessment  and states he coughs because of cigars but denies any acute illness or this cough being unusual   Biggest fear is being in a nursing home; 62  Social History   Social History  . Marital status: Married    Spouse name: N/A  . Number of children: N/A  . Years of education: N/A   Occupational History  . Not on file.   Social History Main Topics  . Smoking status: Current Every Day Smoker    Types: Cigars  . Smokeless tobacco: Not on file     Comment: 2 cigars per day/w down to one a day   . Alcohol use 0.0 oz/week     Comment: 2 beers per month  . Drug use: No    . Sexual activity: Not on file   Other Topics Concern  . Not on file   Social History Narrative   Separated. 3 kids. 10 grandkids.       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       Hobbies: reading, used to play a lot of golf     Tobacco updated:  Down to one cigar a day  How many drinks do you have per week? 2 beers per month   Medications no issues  BP 160 / 80 and down to 140/80  BMI: 28  Diet;  Usually cooks and eats breakfast and dinner Eats whatever he wants Does not worry about carbs Loves fruit; does not eat vegetables  Eats salad    A1c 7.0;   Exercise; States his knees started hurting last 1 to 2 years Standing he is ok; earlier today hurting 1 -10 ; pain is a 7 or 8 when bad  Used to play golf; stopped x 4 years  Due to the people he was with having outside issues.  Does nothing during the day; reads book, watches movies And this does not bother and he feels no need to change.   HOME SAFETY;  Fall  hx; one fall; just tripped; no dizziness from sitting to standing Given education on "Fall Prevention in the Home" for more safety tips the patient can apply as appropriate.  Long term goal is to "age in place" or undecided   Safety features reviewed for safe community; 2 level home but stays on one level; has neighbors  firearms if in the home;  no smoke alarms; yes sun protection when outside; no; does not worry    Mental Health:  Any emotional problems? Anxious, depressed, irritable, sad or blue? Appears to be irritable but denies being irritable;  Denies feeling depressed or hopeless; voices pleasure in daily life How many social activities have you been engaged in within the last 2 weeks? Does not engage in any social activities Stopped playing golf x 71 yo  Dreads going shopping;   Pain: 8 to 10 some days "due to OA"   Depression; lack of interest in engaging. Mentioned he is separated; still on good terms with wife who lives in [place];   3 sons; but on dtr in law just walked out with 3 children   Other 2 children live up [place] it does not bother him  Support: is son in [place] 5 of 9; Denies depression; He did agreed his mood was not as light as it used to be   Cognitive;  Fear of dementia Fear of becoming addictive to opiates and "will not have surgery even if needed".   Manages checkbook, medications; no failures of task Used to travel  Ad8 score reviewed for issues;  Issues making decisions; no  Less interest in hobbies / activities" no  Repeats questions, stories; family complaining: NO  Trouble using ordinary gadgets; microwave; computer: no  Forgets the month or year: no  Mismanaging finances: no  Missing apt: no but does write them down  Daily problems with thinking of memory NO Ad8 score is 0   Any dizziness when standing up? no Mobilization and Functional losses from last year to this year? YEs; stays at home   Sleep pattern changes; no issues    Hearing Screening   '125Hz'$  '250Hz'$  '500Hz'$  '1000Hz'$  '2000Hz'$  '3000Hz'$  '4000Hz'$  '6000Hz'$  '8000Hz'$   Right ear:     100      Left ear:     100      Vision Screening Comments: Vision checks  Annually Local optometrist in [place] Reports on diabetic retinopathy     Advanced Directive addressed; declines and states his wife knows what to do   Health Maintenance Due  Topic Date Due  . TETANUS/TDAP  12/23/2015  . FOOT EXAM  01/29/2016   Dr. Yong Channel to check;  No issues with his feet and declines foot exam as this is not necessary   Will plan to take Tdap   w blood work in June    Colonoscopy; 04/2018 / not going to repeat these things  Prostate cancer screening: not going to repeat   Health Recommendations and Referrals  Barriers to Success Possible cognitive issues vs depression      Objective:    Vitals: BP 140/80   Pulse 71   Ht '5\' 7"'$  (1.702 m)   Wt 178 lb 6 oz (80.9 kg)   SpO2 98%   BMI 27.94 kg/m   Body mass index is  27.94 kg/m.  Tobacco History  Smoking Status  . Current Every Day Smoker  . Types: Cigars  Smokeless Tobacco  . Not on file    Comment:  2 cigars per day/w down to one a day      Ready to quit: No Counseling given: Yes   Past Medical History:  Diagnosis Date  . CAD (coronary artery disease)   . Cerebrovascular accident ([place]) 4/01  . Diabetes mellitus 2005   type 2  . Heart attack 2005   w/ Taxus stenting to a native vessel   . Heart attack 1991  . Hyperlipidemia   . Positive PPD    Past Surgical History:  Procedure Laterality Date  . APPENDECTOMY     as child  . CORONARY ARTERY BYPASS GRAFT  1991  . INGUINAL HERNIA REPAIR  1985   Family History  Problem Relation Age of Onset  . Hodgkin's lymphoma Father     died in 78s   History  Sexual Activity  . Sexual activity: Not on file    Outpatient Encounter Prescriptions as of 04/07/2016  Medication Sig  . amLODipine (NORVASC) 10 MG tablet TAKE 1 TABLET DAILY  . aspirin 325 MG EC tablet Take 325 mg by mouth daily.   Marland Kitchen atorvastatin (LIPITOR) 20 MG tablet TAKE 1 TABLET DAILY  . enalapril (VASOTEC) 20 MG tablet TAKE 1 TABLET TWICE A DAY  . glimepiride (AMARYL) 2 MG tablet Take 1 tablet (2 mg total) by mouth every morning.  . metFORMIN (GLUCOPHAGE) 1000 MG tablet TAKE 1 TABLET TWICE A DAY  . metoprolol (LOPRESSOR) 50 MG tablet TAKE 1 TABLET TWICE A DAY  . [DISCONTINUED] amLODipine (NORVASC) 10 MG tablet Take 1 tablet (10 mg total) by mouth daily.   No facility-administered encounter medications on file as of 04/07/2016.     Activities of Daily Living No flowsheet data found.  Patient Care Team: Marin Olp, MD as PCP - General (Family Medicine)   Assessment:     Exercise Activities and Dietary recommendations    Goals    . patient          Plan a trip; go to [place]  04/07/2016 01/28/2016 01/29/2015 06/27/2013  Falls in the past year? Yes No No No  Number falls in past yr: 1 -  - -  Follow up Education provided - - -   Depression Screen PHQ 2/9 Scores 04/07/2016 01/29/2015 06/27/2013 01/03/2012  PHQ - 2 Score 3 0 0 0  PHQ- 9 Score 5 - - -   See note above  Cognitive Function MMSE - Mini Mental State Exam 04/07/2016  Not completed: (No Data)    see note above    Immunization History  Administered Date(s) Administered  . Influenza Whole 11/17/2008, 11/21/2009, 01/03/2012  . Influenza, High Dose Seasonal PF 10/30/2014, 01/28/2016  . Influenza,inj,Quad PF,36+ Mos 12/31/2013  . Influenza-Unspecified 10/22/2012  . Pneumococcal Conjugate-13 06/27/2013  . Pneumococcal Polysaccharide-23 11/05/2007, 10/30/2014  . Td 12/22/2005  . Zoster 01/17/2011   Screening Tests Health Maintenance  Topic Date Due  . TETANUS/TDAP  12/23/2015  . FOOT EXAM  01/29/2016  . HEMOGLOBIN A1C  07/28/2016  . OPHTHALMOLOGY EXAM  08/19/2016  . COLONOSCOPY  05/20/2018  . INFLUENZA VACCINE  Completed  . ZOSTAVAX  Completed  . Hepatitis C Screening  Completed  . PNA vac Low Risk Adult  Completed      Plan:      PCP Notes  Health Maintenance Will take his tetanus in June when he comes back for A1c check   Declines foot exam even after discussing; states he knows of  people who lose their toes etc but his feet are fine and this is not necessary. States he has no issues; sensation is good; color good and he takes care of his toenails.   Abnormal Screens   Referrals per Dr. Yong Channel as appropriate C/o of pain 8 scale of 1-10 but thinks this is his knees and not hips; Agrees to go to sports medicine if Dr. Yong Channel agrees to referral   Patient concerns; Fears Addiction to drugs and therefore will not consider surgery of hip or knees even if needed/ educated regarding additions  Changes in mood over the last year, PHQ 5; denies depression but admits to mood changes Recommended an apt with Dr. Yong Channel to talk specifically these issues but he declines.   Had a deep cough throughout the  exam but states he is fine. Mentioned AAA and fup for hx of smoking and states he is fine; declines any further assessment  Nurse Concerns; Untreated changes in mood  or cognitive changes to  an unknown degree.  Resistant to dx and fup. Asked if I was going to check his BP and had no recollection that BP was taken earlier   Next PCP apt  June 8th with Dr. Yong Channel Discussed apt with sports medicine. Will not go back to [facility]; but would consider sports medicine if Dr. Yong Channel agrees  Appears to be isolated; will call his son in [place] if he needs something. Sits at home and read etc, but states clearly "this is not a problem'    During the course of the visit the patient was educated and counseled about the following appropriate screening and preventive services:   Vaccines to include Pneumoccal, Influenza, Hepatitis B, Td, Zostavax, HCV  Electrocardiogram  Cardiovascular Disease  Colorectal cancer screening  Diabetes screening  Prostate Cancer Screening  Glaucoma screening  Nutrition counseling   Smoking cessation counseling  Patient Instructions (the written plan) was given to the patient.    Wynetta Fines, RN  04/07/2016

## 2016-04-07 ENCOUNTER — Other Ambulatory Visit: Payer: Self-pay

## 2016-04-07 ENCOUNTER — Telehealth: Payer: Self-pay

## 2016-04-07 ENCOUNTER — Ambulatory Visit (INDEPENDENT_AMBULATORY_CARE_PROVIDER_SITE_OTHER): Payer: Medicare Other

## 2016-04-07 VITALS — BP 140/80 | HR 71 | Ht 67.0 in | Wt 178.4 lb

## 2016-04-07 DIAGNOSIS — Z0000 Encounter for general adult medical examination without abnormal findings: Secondary | ICD-10-CM | POA: Diagnosis not present

## 2016-04-07 DIAGNOSIS — M25561 Pain in right knee: Secondary | ICD-10-CM

## 2016-04-07 DIAGNOSIS — M25562 Pain in left knee: Principal | ICD-10-CM

## 2016-04-07 NOTE — Telephone Encounter (Signed)
Please refer to sports medicine Benjamin Browning under bilateral knee pain

## 2016-04-07 NOTE — Telephone Encounter (Signed)
Referral to Sports Medicine completed

## 2016-04-07 NOTE — Telephone Encounter (Signed)
At [facility] today; still c/o of pain and agreed that a sports medicine doctor may help him. Would like to pursue anything that would lessen his pain which is described as 8 when it is bad. Does not want to go back to [facility].   Please advise, sh

## 2016-04-07 NOTE — Progress Notes (Signed)
I have reviewed and agree with note, evaluation, plan. We entered sports medicine referral. Discussed with susan offering follow up for cough sooner and she stated he declined.   Garret Reddish, MD

## 2016-04-07 NOTE — Patient Instructions (Addendum)
Mr. Code , Thank you for taking time to come for your Medicare Wellness Visit. I appreciate your ongoing commitment to your health goals. Please review the following plan we discussed and let me know if I can assist you in the future.   Discussed seeing a sports medicine doctor if Dr. Yong Channel agrees   Note our discussion of pain meds when and if you ever need them   Will consider setting a goals just for you!    These are the goals we discussed: Goals    . patient          Plan a trip; go to [place]        This is a list of the screening recommended for you and due dates:  Health Maintenance  Topic Date Due  . Tetanus Vaccine  12/23/2015  . Complete foot exam   01/29/2016  . Hemoglobin A1C  07/28/2016  . Eye exam for diabetics  08/19/2016  . Colon Cancer Screening  05/20/2018  . Flu Shot  Completed  . Shingles Vaccine  Completed  .  Hepatitis C: One time screening is recommended by Center for Disease Control  (CDC) for  adults born from [birth year] through 1965.   Completed  . Pneumonia vaccines  Completed       Fall Prevention in the Home Introduction Falls can cause injuries. They can happen to people of all ages. There are many things you can do to make your home safe and to help prevent falls. What can I do on the outside of my home?  Regularly fix the edges of walkways and driveways and fix any cracks.  Remove anything that might make you trip as you walk through a door, such as a raised step or threshold.  Trim any bushes or trees on the path to your home.  Use bright outdoor lighting.  Clear any walking paths of anything that might make someone trip, such as rocks or tools.  Regularly check to see if handrails are loose or broken. Make sure that both sides of any steps have handrails.  Any raised decks and porches should have guardrails on the edges.  Have any leaves, snow, or ice cleared regularly.  Use sand or salt on walking paths during winter.  Clean up  any spills in your garage right away. This includes oil or grease spills. What can I do in the bathroom?  Use night lights.  Install grab bars by the toilet and in the tub and shower. Do not use towel bars as grab bars.  Use non-skid mats or decals in the tub or shower.  If you need to sit down in the shower, use a plastic, non-slip stool.  Keep the floor dry. Clean up any water that spills on the floor as soon as it happens.  Remove soap buildup in the tub or shower regularly.  Attach bath mats securely with double-sided non-slip rug tape.  Do not have throw rugs and other things on the floor that can make you trip. What can I do in the bedroom?  Use night lights.  Make sure that you have a light by your bed that is easy to reach.  Do not use any sheets or blankets that are too big for your bed. They should not hang down onto the floor.  Have a firm chair that has side arms. You can use this for support while you get dressed.  Do not have throw rugs and other things on the  floor that can make you trip. What can I do in the kitchen?  Clean up any spills right away.  Avoid walking on wet floors.  Keep items that you use a lot in easy-to-reach places.  If you need to reach something above you, use a strong step stool that has a grab bar.  Keep electrical cords out of the way.  Do not use floor polish or wax that makes floors slippery. If you must use wax, use non-skid floor wax.  Do not have throw rugs and other things on the floor that can make you trip. What can I do with my stairs?  Do not leave any items on the stairs.  Make sure that there are handrails on both sides of the stairs and use them. Fix handrails that are broken or loose. Make sure that handrails are as long as the stairways.  Check any carpeting to make sure that it is firmly attached to the stairs. Fix any carpet that is loose or worn.  Avoid having throw rugs at the top or bottom of the stairs. If  you do have throw rugs, attach them to the floor with carpet tape.  Make sure that you have a light switch at the top of the stairs and the bottom of the stairs. If you do not have them, ask someone to add them for you. What else can I do to help prevent falls?  Wear shoes that:  Do not have high heels.  Have rubber bottoms.  Are comfortable and fit you well.  Are closed at the toe. Do not wear sandals.  If you use a stepladder:  Make sure that it is fully opened. Do not climb a closed stepladder.  Make sure that both sides of the stepladder are locked into place.  Ask someone to hold it for you, if possible.  Clearly mark and make sure that you can see:  Any grab bars or handrails.  First and last steps.  Where the edge of each step is.  Use tools that help you move around (mobility aids) if they are needed. These include:  Canes.  Walkers.  Scooters.  Crutches.  Turn on the lights when you go into a dark area. Replace any light bulbs as soon as they burn out.  Set up your furniture so you have a clear path. Avoid moving your furniture around.  If any of your floors are uneven, fix them.  If there are any pets around you, be aware of where they are.  Review your medicines with your doctor. Some medicines can make you feel dizzy. This can increase your chance of falling. Ask your doctor what other things that you can do to help prevent falls. This information is not intended to replace advice given to you by your health care provider. Make sure you discuss any questions you have with your health care provider. Document Released: 12/04/2008 Document Revised: 07/16/2015 Document Reviewed: 03/14/2014  2017 Elsevier  Health Maintenance, Male A healthy lifestyle and preventative care can promote health and wellness.  Maintain regular health, dental, and eye exams.  Eat a healthy diet. Foods like vegetables, fruits, whole grains, low-fat dairy products, and lean  protein foods contain the nutrients you need and are low in calories. Decrease your intake of foods high in solid fats, added sugars, and salt. Get information about a proper diet from your health care provider, if necessary.  Regular physical exercise is one of the most important things you  can do for your health. Most adults should get at least 150 minutes of moderate-intensity exercise (any activity that increases your heart rate and causes you to sweat) each week. In addition, most adults need muscle-strengthening exercises on 2 or more days a week.   Maintain a healthy weight. The body mass index (BMI) is a screening tool to identify possible weight problems. It provides an estimate of body fat based on height and weight. Your health care provider can find your BMI and can help you achieve or maintain a healthy weight. For males 20 years and older:  A BMI below 18.5 is considered underweight.  A BMI of 18.5 to 24.9 is normal.  A BMI of 25 to 29.9 is considered overweight.  A BMI of 30 and above is considered obese.  Maintain normal blood lipids and cholesterol by exercising and minimizing your intake of saturated fat. Eat a balanced diet with plenty of fruits and vegetables. Blood tests for lipids and cholesterol should begin at age 44 and be repeated every 5 years. If your lipid or cholesterol levels are high, you are over age 31, or you are at high risk for heart disease, you may need your cholesterol levels checked more frequently.Ongoing high lipid and cholesterol levels should be treated with medicines if diet and exercise are not working.  If you smoke, find out from your health care provider how to quit. If you do not use tobacco, do not start.  Lung cancer screening is recommended for adults aged 47-80 years who are at high risk for developing lung cancer because of a history of smoking. A yearly low-dose CT scan of the lungs is recommended for people who have at least a 30-pack-year  history of smoking and are current smokers or have quit within the past 15 years. A pack year of smoking is smoking an average of 1 pack of cigarettes a day for 1 year (for example, a 30-pack-year history of smoking could mean smoking 1 pack a day for 30 years or 2 packs a day for 15 years). Yearly screening should continue until the smoker has stopped smoking for at least 15 years. Yearly screening should be stopped for people who develop a health problem that would prevent them from having lung cancer treatment.  If you choose to drink alcohol, do not have more than 2 drinks per day. One drink is considered to be 12 oz (360 mL) of beer, 5 oz (150 mL) of wine, or 1.5 oz (45 mL) of liquor.  Avoid the use of street drugs. Do not share needles with anyone. Ask for help if you need support or instructions about stopping the use of drugs.  High blood pressure causes heart disease and increases the risk of stroke. High blood pressure is more likely to develop in:  People who have blood pressure in the end of the normal range (100-139/85-89 mm Hg).  People who are overweight or obese.  People who are African American.  If you are 75-66 years of age, have your blood pressure checked every 3-5 years. If you are 13 years of age or older, have your blood pressure checked every year. You should have your blood pressure measured twice-once when you are at a hospital or clinic, and once when you are not at a hospital or clinic. Record the average of the two measurements. To check your blood pressure when you are not at a hospital or clinic, you can use:  An automated blood pressure machine  at a pharmacy.  A home blood pressure monitor.  If you are 42-53 years old, ask your health care provider if you should take aspirin to prevent heart disease.  Diabetes screening involves taking a blood sample to check your fasting blood sugar level. This should be done once every 3 years after age 45 if you are at a  normal weight and without risk factors for diabetes. Testing should be considered at a younger age or be carried out more frequently if you are overweight and have at least 1 risk factor for diabetes.  Colorectal cancer can be detected and often prevented. Most routine colorectal cancer screening begins at the age of 25 and continues through age 35. However, your health care provider may recommend screening at an earlier age if you have risk factors for colon cancer. On a yearly basis, your health care provider may provide home test kits to check for hidden blood in the stool. A small camera at the end of a tube may be used to directly examine the colon (sigmoidoscopy or colonoscopy) to detect the earliest forms of colorectal cancer. Talk to your health care provider about this at age 37 when routine screening begins. A direct exam of the colon should be repeated every 5-10 years through age 58, unless early forms of precancerous polyps or small growths are found.  People who are at an increased risk for hepatitis B should be screened for this virus. You are considered at high risk for hepatitis B if:  You were born in a country where hepatitis B occurs often. Talk with your health care provider about which countries are considered high risk.  Your parents were born in a high-risk country and you have not received a shot to protect against hepatitis B (hepatitis B vaccine).  You have HIV or AIDS.  You use needles to inject street drugs.  You live with, or have sex with, someone who has hepatitis B.  You are a man who has sex with other men (MSM).  You get hemodialysis treatment.  You take certain medicines for conditions like cancer, organ transplantation, and autoimmune conditions.  Hepatitis C blood testing is recommended for all people born from [birth year] through 1965 and any individual with known risk factors for hepatitis C.  Healthy men should no longer receive prostate-specific antigen  (PSA) blood tests as part of routine cancer screening. Talk to your health care provider about prostate cancer screening.  Testicular cancer screening is not recommended for adolescents or adult males who have no symptoms. Screening includes self-exam, a health care provider exam, and other screening tests. Consult with your health care provider about any symptoms you have or any concerns you have about testicular cancer.  Practice safe sex. Use condoms and avoid high-risk sexual practices to reduce the spread of sexually transmitted infections (STIs).  You should be screened for STIs, including gonorrhea and chlamydia if:  You are sexually active and are younger than 24 years.  You are older than 24 years, and your health care provider tells you that you are at risk for this type of infection.  Your sexual activity has changed since you were last screened, and you are at an increased risk for chlamydia or gonorrhea. Ask your health care provider if you are at risk.  If you are at risk of being infected with HIV, it is recommended that you take a prescription medicine daily to prevent HIV infection. This is called pre-exposure prophylaxis (PrEP). You are considered  at risk if:  You are a man who has sex with other men (MSM).  You are a heterosexual man who is sexually active with multiple partners.  You take drugs by injection.  You are sexually active with a partner who has HIV.  Talk with your health care provider about whether you are at high risk of being infected with HIV. If you choose to begin PrEP, you should first be tested for HIV. You should then be tested every 3 months for as long as you are taking PrEP.  Use sunscreen. Apply sunscreen liberally and repeatedly throughout the day. You should seek shade when your shadow is shorter than you. Protect yourself by wearing long sleeves, pants, a wide-brimmed hat, and sunglasses year round whenever you are outdoors.  Tell your health  care provider of new moles or changes in moles, especially if there is a change in shape or color. Also, tell your health care provider if a mole is larger than the size of a pencil eraser.  A one-time screening for abdominal aortic aneurysm (AAA) and surgical repair of large AAAs by ultrasound is recommended for men aged 48-75 years who are current or former smokers.  Stay current with your vaccines (immunizations). This information is not intended to replace advice given to you by your health care provider. Make sure you discuss any questions you have with your health care provider. Document Released: 08/06/2007 Document Revised: 02/28/2014 Document Reviewed: 11/11/2014 Elsevier Interactive Patient Education  2017 [employer].

## 2016-04-20 ENCOUNTER — Ambulatory Visit (INDEPENDENT_AMBULATORY_CARE_PROVIDER_SITE_OTHER): Payer: Medicare Other | Admitting: Student

## 2016-04-20 ENCOUNTER — Encounter: Payer: Self-pay | Admitting: Student

## 2016-04-20 DIAGNOSIS — G8929 Other chronic pain: Secondary | ICD-10-CM

## 2016-04-20 DIAGNOSIS — M25562 Pain in left knee: Secondary | ICD-10-CM

## 2016-04-20 DIAGNOSIS — M25561 Pain in right knee: Secondary | ICD-10-CM

## 2016-04-20 MED ORDER — METHYLPREDNISOLONE ACETATE 40 MG/ML IJ SUSP
40.0000 mg | Freq: Once | INTRAMUSCULAR | Status: AC
  Administered 2016-04-20: 40 mg via INTRA_ARTICULAR

## 2016-04-20 NOTE — Assessment & Plan Note (Signed)
Mild OA on x-rays, bilateral knee injections performed today.  If continued pain, would have follow up to reassess if coming from hip or knee.  May consider hip x-rays or knee MRI to check for degenerative meniscus tear.  Follow up as needed.  Avoid NSAIDs due to h/o CAD, discussed using tylenol.

## 2016-04-20 NOTE — Progress Notes (Signed)
  Benjamin Browning - 71 y.o. male MRN [id number]  Date of birth: 04-23-1945  SUBJECTIVE:  Including CC & ROS.  CC: bilateral knee pain  Presents with bilateral knee pain. Has been ongoing for the past few months. He denies any specific injury. He had x-rays which showed a mild amount of arthritis. He has had injections in the past for his hip and his low back. He reports that his knee buckles at times in his left knee is worse than his right knee. He denies any catching, locking. Denies any numbness or tingling distally.    ROS: No unexpected weight loss, fever, chills, swelling, instability, muscle pain, numbness/tingling, redness, otherwise see HPI   PMHx - Updated and reviewed.  Contributory factors include: HTN, CAD, DM, bladder cancer, CVA PSHx - Updated and reviewed.  Contributory factors include:  Negative FHx - Updated and reviewed.  Contributory factors include:  Negative Social Hx - Updated and reviewed. Contributory factors include: smokes cigars Medications - reviewed   DATA REVIEWED: Previous office visits from PCP Standing x-rays with mild OA  PHYSICAL EXAM:  VS: BP:128/71  HR: bpm  TEMP: ( )  RESP:   HT:'5\' 7"'$  (170.2 cm)   WT:175 lb (79.4 kg)  BMI:27.5 PHYSICAL EXAM: Gen: NAD, alert, cooperative with exam, well-appearing HEENT: clear conjunctiva,  CV:  no edema, capillary refill brisk, normal rate Resp: non-labored Skin: no rashes, normal turgor  Neuro: no gross deficits.  Psych:  alert and oriented Knee: Normal to inspection with no erythema or effusion or obvious bony abnormalities. Palpation normal with no warmth, joint line tenderness, patellar tenderness, or condyle tenderness. ROM full in flexion and extension and lower leg rotation. Ligaments with solid consistent endpoints including ACL, PCL, LCL, MCL. Negative Mcmurray's, Apley's, and Thessalonian tests. Non painful patellar compression. Patellar glide without crepitus. Patellar and quadriceps tendons  unremarkable. Hamstring and quadriceps strength is normal.  Left hip with limited IR and ER, right hip with normal IR and ER, no pain with log roll bilaterally   ASSESSMENT & PLAN:   Bilateral knee pain Mild OA on x-rays, bilateral knee injections performed today.  If continued pain, would have follow up to reassess if coming from hip or knee.  May consider hip x-rays or knee MRI to check for degenerative meniscus tear.  Follow up as needed.  Avoid NSAIDs due to h/o CAD, discussed using tylenol.  Procedure:  Injection of left knee Consent obtained and verified. Time-out conducted. Noted no overlying erythema, induration, or other signs of local infection. Skin prepped in a sterile fashion. Topical analgesic spray: Ethyl chloride. Completed without difficulty. Meds: 40 mg depomedrol, 3 cc 1% lidocaine Pain immediately improved suggesting accurate placement of the medication. Advised to call if fevers/chills, erythema, induration, drainage, or persistent bleeding.   Procedure:  Injection of right knee Consent obtained and verified. Time-out conducted. Noted no overlying erythema, induration, or other signs of local infection. Skin prepped in a sterile fashion. Topical analgesic spray: Ethyl chloride. Completed without difficulty. Meds: 40 mg depomedrol, 3 cc 1% lidocaine Pain immediately improved suggesting accurate placement of the medication. Advised to call if fevers/chills, erythema, induration, drainage, or persistent bleeding.

## 2016-05-18 ENCOUNTER — Encounter: Payer: Self-pay | Admitting: Student

## 2016-05-18 ENCOUNTER — Other Ambulatory Visit: Payer: Self-pay | Admitting: Family Medicine

## 2016-05-18 ENCOUNTER — Ambulatory Visit (INDEPENDENT_AMBULATORY_CARE_PROVIDER_SITE_OTHER): Payer: Medicare Other | Admitting: Student

## 2016-05-18 VITALS — BP 138/70 | Ht 67.0 in | Wt 175.0 lb

## 2016-05-18 DIAGNOSIS — G8929 Other chronic pain: Secondary | ICD-10-CM

## 2016-05-18 DIAGNOSIS — M25562 Pain in left knee: Secondary | ICD-10-CM | POA: Diagnosis not present

## 2016-05-18 DIAGNOSIS — M25561 Pain in right knee: Secondary | ICD-10-CM

## 2016-05-18 NOTE — Assessment & Plan Note (Signed)
Mild arthritic changes seen on x-ray. Injections only helped for a week. He would like answers for her what is causing his pain. Will get an MRI of his left knee since this is the worse one to assess for a medial meniscus tear.

## 2016-05-18 NOTE — Progress Notes (Signed)
  Benjamin Browning - 71 y.o. male MRN [id number]  Date of birth: September 09, 1945  SUBJECTIVE:  Including CC & ROS.  CC: bilateral knee pain L>R  Presents in follow-up for left greater than right knee pain. He was seen a month ago and both of his knees were injected. He states that this helped for about a week. He mainly has pain when he is walking and a lot of stiffness. He does report buckling. Swelling occasionally. No specific catching or locking. No numbness or tingling.   ROS: No unexpected weight loss, fever, chills, swelling, instability, muscle pain, numbness/tingling, redness, otherwise see HPI   PMHx - Updated and reviewed.  Contributory factors include: Negative PSHx - Updated and reviewed.  Contributory factors include:  Negative FHx - Updated and reviewed.  Contributory factors include:  Negative Social Hx - Updated and reviewed. Contributory factors include: Negative Medications - reviewed   DATA REVIEWED: [facility] standing knee x-rays 01/2016: mild OA  PHYSICAL EXAM:  VS: BP:138/70  HR: bpm  TEMP: ( )  RESP:   HT:'5\' 7"'$  (170.2 cm)   WT:175 lb (79.4 kg)  BMI:27.5 PHYSICAL EXAM: Gen: NAD, alert, cooperative with exam, well-appearing HEENT: clear conjunctiva,  CV:  no edema, capillary refill brisk, normal rate Resp: non-labored Skin: no rashes, normal turgor  Neuro: no gross deficits.  Psych:  alert and oriented  Knee: Normal to inspection with no erythema or effusion or obvious bony abnormalities. Palpation normal with no warmth, joint line tenderness, patellar tenderness, or condyle tenderness. ROM full in flexion and extension and lower leg rotation. Ligaments with solid consistent endpoints including ACL, PCL, LCL, MCL. + Medial Mcmurray's, Apley's, and Thessalonian tests. Non painful patellar compression. Patellar glide without crepitus. Patellar and quadriceps tendons unremarkable. Hamstring and quadriceps strength is normal.     ASSESSMENT & PLAN:   Bilateral knee  pain Mild arthritic changes seen on x-ray. Injections only helped for a week. He would like answers for her what is causing his pain. Will get an MRI of his left knee since this is the worse one to assess for a medial meniscus tear.

## 2016-05-26 ENCOUNTER — Inpatient Hospital Stay: Admission: RE | Admit: 2016-05-26 | Payer: Medicare Other | Source: Ambulatory Visit

## 2016-06-02 ENCOUNTER — Ambulatory Visit
Admission: RE | Admit: 2016-06-02 | Discharge: 2016-06-02 | Disposition: A | Discharge status: Home / Self Care | Payer: Medicare Other | Source: Ambulatory Visit | Attending: Student | Admitting: Student

## 2016-06-02 DIAGNOSIS — G8929 Other chronic pain: Secondary | ICD-10-CM

## 2016-06-02 DIAGNOSIS — M25561 Pain in right knee: Principal | ICD-10-CM

## 2016-06-02 DIAGNOSIS — M25562 Pain in left knee: Principal | ICD-10-CM

## 2016-06-08 ENCOUNTER — Telehealth: Payer: Self-pay | Admitting: Student

## 2016-06-08 ENCOUNTER — Encounter: Payer: Self-pay | Admitting: *Deleted

## 2016-06-08 DIAGNOSIS — M25552 Pain in left hip: Secondary | ICD-10-CM

## 2016-06-08 DIAGNOSIS — G8929 Other chronic pain: Secondary | ICD-10-CM

## 2016-06-08 DIAGNOSIS — M25561 Pain in right knee: Principal | ICD-10-CM

## 2016-06-08 DIAGNOSIS — M25562 Pain in left knee: Principal | ICD-10-CM

## 2016-06-08 DIAGNOSIS — M25551 Pain in right hip: Secondary | ICD-10-CM

## 2016-06-08 NOTE — Telephone Encounter (Signed)
-----   Message from Carolyne Littles sent at 06/08/2016  2:19 PM EDT ----- Contact: patient Please call patient back about his results.

## 2016-06-08 NOTE — Telephone Encounter (Signed)
Called patient verified name and date of birth. Discussed normal knee MRI. Because of this, believe we are having referred pain from the hip to the knee. Would recommend x-rays to take a look at the hip and we'll call him with the results. Likely he will need to follow up to get ultrasound-guided hip injection if it shows arthritis.  Signed,  Balinda Quails, DO [facility] 2:44 PM

## 2016-06-08 NOTE — Telephone Encounter (Signed)
Called pt, left voicemail to call back to go over MRI of knee.  It was negative, so need to investigate hip.   Signed,  Balinda Quails, DO [facility] 1:37 PM

## 2016-06-13 ENCOUNTER — Ambulatory Visit
Admission: RE | Admit: 2016-06-13 | Discharge: 2016-06-13 | Disposition: A | Discharge status: Home / Self Care | Payer: Medicare Other | Source: Ambulatory Visit | Attending: Student | Admitting: Student

## 2016-06-13 DIAGNOSIS — M1711 Unilateral primary osteoarthritis, right knee: Secondary | ICD-10-CM | POA: Diagnosis not present

## 2016-06-13 DIAGNOSIS — M1712 Unilateral primary osteoarthritis, left knee: Secondary | ICD-10-CM | POA: Diagnosis not present

## 2016-06-13 DIAGNOSIS — M25561 Pain in right knee: Principal | ICD-10-CM

## 2016-06-13 DIAGNOSIS — M25552 Pain in left hip: Secondary | ICD-10-CM

## 2016-06-13 DIAGNOSIS — M25562 Pain in left knee: Principal | ICD-10-CM

## 2016-06-13 DIAGNOSIS — G8929 Other chronic pain: Secondary | ICD-10-CM

## 2016-06-13 DIAGNOSIS — M160 Bilateral primary osteoarthritis of hip: Secondary | ICD-10-CM | POA: Diagnosis not present

## 2016-06-13 DIAGNOSIS — M25551 Pain in right hip: Secondary | ICD-10-CM

## 2016-06-14 ENCOUNTER — Telehealth: Payer: Self-pay | Admitting: Student

## 2016-06-14 NOTE — Telephone Encounter (Signed)
Called pt and verified name and DOB.  Reviewed b/l hip x-rays which showed moderately severe hip OA.  Recommend b/l hip injections under ultrasound guidance if it is still bothering him.  Will get him in for this procedure.    Signed,  Balinda Quails, DO [facility] 3:16 PM 06/14/16

## 2016-06-29 ENCOUNTER — Ambulatory Visit: Payer: Self-pay

## 2016-06-29 ENCOUNTER — Ambulatory Visit (INDEPENDENT_AMBULATORY_CARE_PROVIDER_SITE_OTHER): Payer: Medicare Other | Admitting: Student

## 2016-06-29 ENCOUNTER — Encounter: Payer: Self-pay | Admitting: Student

## 2016-06-29 VITALS — BP 155/73 | Ht 67.0 in | Wt 175.0 lb

## 2016-06-29 DIAGNOSIS — M25552 Pain in left hip: Secondary | ICD-10-CM | POA: Diagnosis not present

## 2016-06-29 DIAGNOSIS — M25551 Pain in right hip: Secondary | ICD-10-CM

## 2016-06-29 DIAGNOSIS — M160 Bilateral primary osteoarthritis of hip: Secondary | ICD-10-CM | POA: Diagnosis not present

## 2016-06-29 MED ORDER — METHYLPREDNISOLONE ACETATE 40 MG/ML IJ SUSP
40.0000 mg | Freq: Once | INTRAMUSCULAR | Status: AC
  Administered 2016-06-29: 40 mg via INTRA_ARTICULAR

## 2016-06-29 MED ORDER — METHYLPREDNISOLONE ACETATE 40 MG/ML IJ SUSP: 40.0000 mg | Freq: Once | INTRAMUSCULAR | Status: DC

## 2016-06-29 NOTE — Assessment & Plan Note (Addendum)
Left hip injected under ultrasound guidance.  Will follow up in 3 weeks to see if left hip improves and will try right hip at that time.  Counseled that may raise blood sugars for a short period of time.  Did counsel on the expected course of his arthritic process. He may need joint replacements. He would like to investigate that further at a later time.

## 2016-06-29 NOTE — Progress Notes (Addendum)
  CARRIE SCHOONMAKER - 71 y.o. male MRN [id number]  Date of birth: 10/05/1945  SUBJECTIVE:  Including CC & ROS.  CC: b/l leg pain  Presents with bilateral knee pain that appears to be coming from moderately severe osteoarthritis in his bilateral hips.  He is having a significant amount of pain and would like to try an injection of his hips.  Left knee pain is worse than his right. He is having difficulty with ambulating.  He does not wish to pursue surgery at this time.   ROS: No unexpected weight loss, fever, chills, swelling, instability, muscle pain, numbness/tingling, redness, otherwise see HPI   PMHx - Updated and reviewed.  Contributory factors include: CAD, stroke, HTN, bladder cancer, HLD PSHx - Updated and reviewed.  Contributory factors include:  CABG FHx - Updated and reviewed.  Contributory factors include:  Negative Social Hx - Updated and reviewed. Contributory factors include: Smoker Medications - reviewed   DATA REVIEWED: Bilateral hip x-rays that show moderately severe OA in b/l hip  PHYSICAL EXAM:  VS: BP:(!) 155/73  HR: bpm  TEMP: ( )  RESP:   HT:'5\' 7"'$  (170.2 cm)   WT:175 lb (79.4 kg)  BMI:27.5 PHYSICAL EXAM: Gen: NAD, alert, cooperative with exam, well-appearing HEENT: clear conjunctiva,  CV:  no edema, capillary refill brisk, normal rate Resp: non-labored Skin: no rashes, normal turgor  Neuro: no gross deficits.  Psych:  alert and oriented  Hip: ROM IR: 15 Deg, ER: 35 Deg, Flexion: 80 Deg, Extension: 30 Deg, Abduction: 35 Deg, Adduction: 35 Deg Strength IR: 5/5, ER: 5/5, Flexion: 5/5, Extension: 5/5, Abduction: 5/5, Adduction: 5/5 Pelvic alignment unremarkable to inspection and palpation. Standing hip rotation and gait without trendelenburg sign / unsteadiness. Greater trochanter without tenderness to palpation. No tenderness over piriformis. no pain with FABER and FADIR. No SI joint tenderness and normal minimal SI movement.   ASSESSMENT & PLAN:    Bilateral primary osteoarthritis of hip Left hip injected under ultrasound guidance.  Will follow up in 3 weeks to see if left hip improves and will try right hip at that time.  Counseled that may raise blood sugars for a short period of time.  Did counsel on the expected course of his arthritic process. He may need joint replacements. He would like to investigate that further at a later time.    Procedure: Real-time Ultrasound Guided Injection of left hip Device: GE Logiq E  Ultrasound guided injection is preferred based studies that show increased duration, increased effect, greater accuracy, decreased procedural pain, increased response rate, and decreased cost with ultrasound guided versus blind injection.  Verbal informed consent obtained.  Time-out conducted.  Noted no overlying erythema, induration, or other signs of local infection.  Skin prepped in a sterile fashion.  Local anesthesia: Topical Ethyl chloride and 3 cc 1% lidocaine With sterile technique and under real time ultrasound guidance: 80 mg depomedrol and 6 cc 1% lidocaine   Completed without difficulty  Pain immediately resolved suggesting accurate placement of the medication.  Advised to call if fevers/chills, erythema, induration, drainage, or persistent bleeding.  Images permanently stored and available for review in the ultrasound unit.  Impression: Technically successful ultrasound guided injection.

## 2016-07-05 ENCOUNTER — Other Ambulatory Visit: Payer: Self-pay | Admitting: Family Medicine

## 2016-07-06 NOTE — Addendum Note (Signed)
Addended by: Cyd Silence on: 07/06/2016 02:51 PM   Modules accepted: Orders

## 2016-07-20 ENCOUNTER — Other Ambulatory Visit: Payer: Medicare Other | Admitting: Sports Medicine

## 2016-07-29 ENCOUNTER — Ambulatory Visit: Payer: Medicare Other | Admitting: Family Medicine

## 2016-08-03 ENCOUNTER — Ambulatory Visit: Payer: Medicare Other | Admitting: Family Medicine

## 2016-08-17 ENCOUNTER — Encounter: Payer: Self-pay | Admitting: Family Medicine

## 2016-08-17 ENCOUNTER — Ambulatory Visit (INDEPENDENT_AMBULATORY_CARE_PROVIDER_SITE_OTHER): Payer: Medicare Other | Admitting: Family Medicine

## 2016-08-17 VITALS — BP 138/78 | HR 71 | Temp 98.2°F | Ht 67.0 in | Wt 171.8 lb

## 2016-08-17 DIAGNOSIS — I2510 Atherosclerotic heart disease of native coronary artery without angina pectoris: Secondary | ICD-10-CM | POA: Diagnosis not present

## 2016-08-17 DIAGNOSIS — F17200 Nicotine dependence, unspecified, uncomplicated: Secondary | ICD-10-CM | POA: Diagnosis not present

## 2016-08-17 DIAGNOSIS — E785 Hyperlipidemia, unspecified: Secondary | ICD-10-CM | POA: Diagnosis not present

## 2016-08-17 DIAGNOSIS — E119 Type 2 diabetes mellitus without complications: Secondary | ICD-10-CM | POA: Diagnosis not present

## 2016-08-17 DIAGNOSIS — I10 Essential (primary) hypertension: Secondary | ICD-10-CM | POA: Diagnosis not present

## 2016-08-17 DIAGNOSIS — Z8551 Personal history of malignant neoplasm of bladder: Secondary | ICD-10-CM

## 2016-08-17 LAB — COMPREHENSIVE METABOLIC PANEL
ALBUMIN: 4.7 g/dL (ref 3.5–5.2)
ALT: 24 U/L (ref 0–53)
AST: 15 U/L (ref 0–37)
Alkaline Phosphatase: 69 U/L (ref 39–117)
BUN: 19 mg/dL (ref 6–23)
CALCIUM: 9.7 mg/dL (ref 8.4–10.5)
CHLORIDE: 102 meq/L (ref 96–112)
CO2: 22 meq/L (ref 19–32)
CREATININE: 1.08 mg/dL (ref 0.40–1.50)
GFR: 71.42 mL/min (ref >60.00)
Glucose, Bld: 114 mg/dL — ABNORMAL HIGH (ref 70–99)
POTASSIUM: 4.3 meq/L (ref 3.5–5.1)
Sodium: 134 mEq/L — ABNORMAL LOW (ref 135–145)
Total Bilirubin: 0.5 mg/dL (ref 0.2–1.2)
Total Protein: 7.4 g/dL (ref 6.0–8.3)

## 2016-08-17 LAB — CBC
HEMATOCRIT: 44.4 % (ref 39.0–52.0)
Hemoglobin: 15 g/dL (ref 13.0–17.0)
MCHC: 33.7 g/dL (ref 30.0–36.0)
MCV: 88.5 fl (ref 78.0–100.0)
PLATELETS: 350 10*3/uL (ref 150.0–400.0)
RBC: 5.02 Mil/uL (ref 4.22–5.81)
RDW: 13.5 % (ref 11.5–15.5)
WBC: 12.7 10*3/uL — ABNORMAL HIGH (ref 4.0–10.5)

## 2016-08-17 LAB — HEMOGLOBIN A1C: Hgb A1c MFr Bld: 7 % — ABNORMAL HIGH (ref 4.6–6.5)

## 2016-08-17 NOTE — Assessment & Plan Note (Addendum)
Declines UA despite bladder cancer history. Declines UA as well

## 2016-08-17 NOTE — Assessment & Plan Note (Signed)
S: well controlled on atorvastatin 20mg  with LDL 70. No myalgias.  A/P: continue current meds and update labs at CPE

## 2016-08-17 NOTE — Assessment & Plan Note (Signed)
1 cigar a day- firmly declines cessation despite CAD- states increases quality of life

## 2016-08-17 NOTE — Assessment & Plan Note (Signed)
S: controlled on Enalapril 20mg  daily, metoprolol 50mg  BID, amlodipine 10mg  ASCVD 10 year risk calculation if age 72-79: elevated but on statin BP Readings from Last 3 Encounters:  08/17/16 138/78  06/29/16 (!) 155/73  05/18/16 138/70  A/P: We discussed blood pressure goal of <140/90. Continue current meds:  We discussed prefer <130 if possible with his CAD history but he firmly declines lifestyle changes or additional meds

## 2016-08-17 NOTE — Assessment & Plan Note (Signed)
S:  controlled. On metformin 1 g BID, amaryl 2mg  once a day CBGs- does not check Exercise and diet- back issues- apparently may need surgery but undecided Lab Results  Component Value Date   HGBA1C 7.0 (H) 01/28/2016   HGBA1C 6.7 (H) 07/28/2015   HGBA1C 6.8 (H) 02/06/2015   A/P: update a1c, cbc, cmp

## 2016-08-17 NOTE — Patient Instructions (Addendum)
Please stop by lab before you go  No planned changes today  Schedule on November 27th at 8 15 Am for next follow up and hopefully that will help [place] use your time more efficiently

## 2016-08-17 NOTE — Progress Notes (Signed)
Subjective:  Benjamin Browning is a 71 y.o. year old very pleasant male patient who presents for/with See problem oriented charting ROS- No chest pain or shortness of breath. No headache or blurry vision.    Past Medical History-  Patient Active Problem List   Diagnosis Date Noted  . DNR (do not resuscitate) 12/31/2013    Priority: High  . TOBACCO USE 03/17/2008    Priority: High  . Diabetes mellitus type II, controlled ([place]) 08/21/2006    Priority: High  . CAD (coronary artery disease) 08/21/2006    Priority: High  . Hyperlipidemia 12/31/2013    Priority: Medium  . Essential hypertension 01/22/2007    Priority: Medium  . History of bladder cancer 08/21/2006    Priority: Medium  . History of CVA (cerebrovascular accident) 08/21/2006    Priority: Medium  . Bilateral primary osteoarthritis of hip 06/29/2016  . Bilateral knee pain 07/26/2012    Medications- reviewed and updated Current Outpatient Prescriptions  Medication Sig Dispense Refill  . amLODipine (NORVASC) 10 MG tablet TAKE 1 TABLET DAILY 90 tablet 3  . aspirin 325 MG EC tablet Take 325 mg by mouth daily.     Marland Kitchen atorvastatin (LIPITOR) 20 MG tablet TAKE 1 TABLET DAILY 90 tablet 3  . enalapril (VASOTEC) 20 MG tablet TAKE 1 TABLET TWICE A DAY 180 tablet 3  . glimepiride (AMARYL) 2 MG tablet Take 1 tablet (2 mg total) by mouth every morning. 90 tablet 1  . metFORMIN (GLUCOPHAGE) 1000 MG tablet TAKE 1 TABLET TWICE A DAY 180 tablet 3  . metoprolol (LOPRESSOR) 50 MG tablet TAKE 1 TABLET TWICE A DAY 180 tablet 3   No current facility-administered medications for this visit.     Objective: BP 138/78 (BP Location: Left Arm, Patient Position: Sitting, Cuff Size: Large)   Pulse 71   Temp 98.2 F (36.8 C) (Oral)   Ht 5\' 7"  (1.702 m)   Wt 171 lb 12.8 oz (77.9 kg)   SpO2 96%   BMI 26.91 kg/m  Gen: NAD, resting comfortably CV: RRR no murmurs rubs or gallops Lungs: CTAB no crackles, wheeze, rhonchi Abdomen:  soft/nontender/nondistended/normal bowel sounds. No rebound or guarding. overweight Ext: no edema Skin: warm, dry Neuro: grossly normal, moves all extremities  Assessment/Plan:  Diabetes mellitus type II, controlled ([place]) S:  controlled. On metformin 1 g BID, amaryl 2mg  once a day CBGs- does not check Exercise and diet- back issues- apparently may need surgery but undecided Lab Results  Component Value Date   HGBA1C 7.0 (H) 01/28/2016   HGBA1C 6.7 (H) 07/28/2015   HGBA1C 6.8 (H) 02/06/2015   A/P: update a1c, cbc, cmp   TOBACCO USE 1 cigar a day- firmly declines cessation despite CAD- states increases quality of life  Hyperlipidemia S: well controlled on atorvastatin 20mg  with LDL 70. No myalgias.  A/P: continue current meds and update labs at CPE  Essential hypertension S: controlled on Enalapril 20mg  daily, metoprolol 50mg  BID, amlodipine 10mg  ASCVD 10 year risk calculation if age 38-79: elevated but on statin BP Readings from Last 3 Encounters:  08/17/16 138/78  06/29/16 (!) 155/73  05/18/16 138/70  A/P: We discussed blood pressure goal of <140/90. Continue current meds:  We discussed prefer <130 if possible with his CAD history but he firmly declines lifestyle changes or additional meds  History of bladder cancer Declines UA despite bladder cancer history. Declines UA as well  CAD (coronary artery disease) Compliant with ASA, atorvastatin. No cardiologist. Asymptomatic. Declines cardiology follow  up once again.    Return in about 6 months (around 02/16/2017) for follow up- or sooner if needed.  Orders Placed This Encounter  Procedures  . Hemoglobin A1c    [place]  . CBC    [place]  . Comprehensive metabolic panel    [place]   Return precautions advised.  Garret Reddish, MD

## 2016-08-17 NOTE — Assessment & Plan Note (Signed)
Compliant with ASA, atorvastatin. No cardiologist. Asymptomatic. Declines cardiology follow up once again.

## 2016-08-25 ENCOUNTER — Other Ambulatory Visit: Payer: Self-pay | Admitting: Family Medicine

## 2017-01-15 ENCOUNTER — Other Ambulatory Visit: Payer: Self-pay | Admitting: Family Medicine

## 2017-01-17 ENCOUNTER — Ambulatory Visit: Payer: Medicare Other | Admitting: Family Medicine

## 2017-01-19 ENCOUNTER — Encounter: Payer: Self-pay | Admitting: Family Medicine

## 2017-01-19 ENCOUNTER — Ambulatory Visit (INDEPENDENT_AMBULATORY_CARE_PROVIDER_SITE_OTHER): Payer: Medicare Other | Admitting: Family Medicine

## 2017-01-19 VITALS — BP 130/72 | HR 75 | Temp 97.9°F | Ht 67.0 in | Wt 180.2 lb

## 2017-01-19 DIAGNOSIS — I2510 Atherosclerotic heart disease of native coronary artery without angina pectoris: Secondary | ICD-10-CM

## 2017-01-19 DIAGNOSIS — F17200 Nicotine dependence, unspecified, uncomplicated: Secondary | ICD-10-CM | POA: Diagnosis not present

## 2017-01-19 DIAGNOSIS — M160 Bilateral primary osteoarthritis of hip: Secondary | ICD-10-CM

## 2017-01-19 DIAGNOSIS — E785 Hyperlipidemia, unspecified: Secondary | ICD-10-CM

## 2017-01-19 DIAGNOSIS — Z23 Encounter for immunization: Secondary | ICD-10-CM

## 2017-01-19 DIAGNOSIS — E119 Type 2 diabetes mellitus without complications: Secondary | ICD-10-CM | POA: Diagnosis not present

## 2017-01-19 DIAGNOSIS — I10 Essential (primary) hypertension: Secondary | ICD-10-CM

## 2017-01-19 LAB — LIPID PANEL
CHOL/HDL RATIO: 4
Cholesterol: 126 mg/dL (ref 0–200)
HDL: 34.9 mg/dL — ABNORMAL LOW (ref >39.00)
LDL CALC: 60 mg/dL (ref 0–99)
NONHDL: 91.23
Triglycerides: 154 mg/dL — ABNORMAL HIGH (ref 0.0–149.0)
VLDL: 30.8 mg/dL (ref 0.0–40.0)

## 2017-01-19 LAB — CBC
HEMATOCRIT: 44.3 % (ref 39.0–52.0)
Hemoglobin: 14.5 g/dL (ref 13.0–17.0)
MCHC: 32.8 g/dL (ref 30.0–36.0)
MCV: 90.6 fl (ref 78.0–100.0)
Platelets: 336 10*3/uL (ref 150.0–400.0)
RBC: 4.89 Mil/uL (ref 4.22–5.81)
RDW: 13.6 % (ref 11.5–15.5)
WBC: 10.6 10*3/uL — AB (ref 4.0–10.5)

## 2017-01-19 LAB — HEMOGLOBIN A1C: HEMOGLOBIN A1C: 7.2 % — AB (ref 4.6–6.5)

## 2017-01-19 LAB — COMPREHENSIVE METABOLIC PANEL
ALT: 14 U/L (ref 0–53)
AST: 15 U/L (ref 0–37)
Albumin: 4.6 g/dL (ref 3.5–5.2)
Alkaline Phosphatase: 66 U/L (ref 39–117)
BUN: 20 mg/dL (ref 6–23)
CHLORIDE: 104 meq/L (ref 96–112)
CO2: 25 meq/L (ref 19–32)
Calcium: 9.8 mg/dL (ref 8.4–10.5)
Creatinine, Ser: 1.18 mg/dL (ref 0.40–1.50)
GFR: 64.4 mL/min (ref >60.00)
GLUCOSE: 98 mg/dL (ref 70–99)
POTASSIUM: 4.2 meq/L (ref 3.5–5.1)
SODIUM: 139 meq/L (ref 135–145)
Total Bilirubin: 0.5 mg/dL (ref 0.2–1.2)
Total Protein: 8.2 g/dL (ref 6.0–8.3)

## 2017-01-19 NOTE — Assessment & Plan Note (Signed)
S: controlled on enalapril 20mg , metoprolol 50mg  BID, amlodipine 10mg .  BP Readings from Last 3 Encounters:  01/19/17 130/72  08/17/16 138/78  06/29/16 (!) 155/73  A/P: We discussed blood pressure goal of <140/90. Continue current meds

## 2017-01-19 NOTE — Patient Instructions (Addendum)
Td shot (tetanus at your pharmacy). If you can find new shingles vaccination Shingrix you are welcome to get it  Please stop by lab before you go  We will call you within a week or two about your referrals- actually you can schedule your sports medicine visit before you leave at check out. If you do not hear within 3 weeks, give [place] a call.

## 2017-01-19 NOTE — Assessment & Plan Note (Signed)
S: bilateral hip arthritis- left is bothering him more than the right A/P: refer to sports medicine- last injection really helped and wants to repeat

## 2017-01-19 NOTE — Assessment & Plan Note (Signed)
S: well controlled on atorvastatin 20mg  with LDL 70. No myalgias.  Lab Results  Component Value Date   CHOL 145 07/28/2015   HDL 39.30 07/28/2015   LDLCALC 51 03/28/2014   LDLDIRECT 66.0 01/28/2016   TRIG 203.0 (H) 07/28/2015   CHOLHDL 4 07/28/2015   A/P: update lipids

## 2017-01-19 NOTE — Assessment & Plan Note (Signed)
S:  controlled. On metformin 1g BID, amaryl 2mg  once a day. Does not check cbgs.  Lab Results  Component Value Date   HGBA1C 7.0 (H) 08/17/2016   HGBA1C 7.0 (H) 01/28/2016   HGBA1C 6.7 (H) 07/28/2015   A/P:  update a1c today. Strongly encouraged diabetic eye exam

## 2017-01-19 NOTE — Assessment & Plan Note (Signed)
S: compliant with asa, atorvastatin. asymptomatic A/P: refer to cardiology today - finally agrees! After years

## 2017-01-19 NOTE — Progress Notes (Signed)
Subjective:  Benjamin Browning is a 71 y.o. year old very pleasant male patient who presents for/with See problem oriented charting ROS- No chest pain or shortness of breath. No headache or blurry vision. Is walking with cane due to hip pain   Past Medical History-  Patient Active Problem List   Diagnosis Date Noted  . DNR (do not resuscitate) 12/31/2013    Priority: High  . TOBACCO USE 03/17/2008    Priority: High  . Diabetes mellitus type II, controlled ([place]) 08/21/2006    Priority: High  . CAD (coronary artery disease) 08/21/2006    Priority: High  . Hyperlipidemia 12/31/2013    Priority: Medium  . Essential hypertension 01/22/2007    Priority: Medium  . History of bladder cancer 08/21/2006    Priority: Medium  . History of CVA (cerebrovascular accident) 08/21/2006    Priority: Medium  . Bilateral primary osteoarthritis of hip 06/29/2016  . Bilateral knee pain 07/26/2012    Medications- reviewed and updated Current Outpatient Medications  Medication Sig Dispense Refill  . amLODipine (NORVASC) 10 MG tablet TAKE 1 TABLET DAILY 90 tablet 3  . aspirin 325 MG EC tablet Take 650 mg by mouth daily.     Marland Kitchen atorvastatin (LIPITOR) 20 MG tablet TAKE 1 TABLET DAILY 90 tablet 3  . enalapril (VASOTEC) 20 MG tablet TAKE 1 TABLET TWICE A DAY 180 tablet 3  . glimepiride (AMARYL) 2 MG tablet Take 1 tablet (2 mg total) by mouth every morning. 90 tablet 1  . metFORMIN (GLUCOPHAGE) 1000 MG tablet TAKE 1 TABLET TWICE A DAY 180 tablet 5  . metoprolol (LOPRESSOR) 50 MG tablet TAKE 1 TABLET TWICE A DAY 180 tablet 3   No current facility-administered medications for this visit.     Objective: BP 130/72   Pulse 75   Temp 97.9 F (36.6 C) (Oral)   Ht 5\' 7"  (1.702 m)   Wt 180 lb 3.2 oz (81.7 kg)   SpO2 97%   BMI 28.22 kg/m  Gen: NAD, resting comfortably CV: RRR no murmurs rubs or gallops Lungs: CTAB no crackles, wheeze, rhonchi Abdomen: soft/nontender/nondistended/normal bowel sounds. No  rebound or guarding.  Ext: no edema Skin: warm, dry Neuro: walking with cane now due to hip pain.  We did not examine hip today  Assessment/Plan:  Other issues 1. declines cigar cessation despite CAD once again - down to 1 a day at least- encouraged complete cessation 2. Declines UA despite bladder cancer history.  3. Declines cardiologist  Hyperlipidemia S: well controlled on atorvastatin 20mg  with LDL 70. No myalgias.  Lab Results  Component Value Date   CHOL 145 07/28/2015   HDL 39.30 07/28/2015   LDLCALC 51 03/28/2014   LDLDIRECT 66.0 01/28/2016   TRIG 203.0 (H) 07/28/2015   CHOLHDL 4 07/28/2015   A/P: update lipids  Essential hypertension S: controlled on enalapril 20mg , metoprolol 50mg  BID, amlodipine 10mg .  BP Readings from Last 3 Encounters:  01/19/17 130/72  08/17/16 138/78  06/29/16 (!) 155/73  A/P: We discussed blood pressure goal of <140/90. Continue current meds  Diabetes mellitus type II, controlled ([place]) S:  controlled. On metformin 1g BID, amaryl 2mg  once a day. Does not check cbgs.  Lab Results  Component Value Date   HGBA1C 7.0 (H) 08/17/2016   HGBA1C 7.0 (H) 01/28/2016   HGBA1C 6.7 (H) 07/28/2015   A/P:  update a1c today. Strongly encouraged diabetic eye exam  CAD (coronary artery disease) S: compliant with asa, atorvastatin. asymptomatic A/P: refer  to cardiology today - finally agrees! After years  Bilateral primary osteoarthritis of hip S: bilateral hip arthritis- left is bothering him more than the right A/P: refer to sports medicine- last injection really helped and wants to repeat  4-6 months depending on a1c- if controlled 6 months ok  Orders Placed This Encounter  Procedures  . Flu vaccine HIGH DOSE PF  . CBC    [place]  . Comprehensive metabolic panel    [place]    Order Specific Question:   Has the patient fasted?    Answer:   No  . Lipid panel    [place]    Order Specific Question:   Has the patient fasted?    Answer:   No   . Hemoglobin A1c    [place]  . Ambulatory referral to Sports Medicine    Referral Priority:   Routine    Referral Type:   Consultation    Referred to Provider:   Gerda Diss, DO    Number of Visits Requested:   1  . Ambulatory referral to Cardiology    Referral Priority:   Routine    Referral Type:   Consultation    Referral Reason:   Specialty Services Required    Requested Specialty:   Cardiology    Number of Visits Requested:   1  . Ambulatory referral to Ophthalmology    Referral Priority:   Routine    Referral Type:   Consultation    Referral Reason:   Specialty Services Required    Requested Specialty:   Ophthalmology    Number of Visits Requested:   1   Return precautions advised.  Garret Reddish, MD

## 2017-01-30 ENCOUNTER — Other Ambulatory Visit: Payer: Self-pay | Admitting: Family Medicine

## 2017-02-09 ENCOUNTER — Ambulatory Visit (INDEPENDENT_AMBULATORY_CARE_PROVIDER_SITE_OTHER): Payer: Medicare Other | Admitting: Sports Medicine

## 2017-02-09 ENCOUNTER — Ambulatory Visit: Payer: Self-pay

## 2017-02-09 ENCOUNTER — Encounter: Payer: Self-pay | Admitting: Sports Medicine

## 2017-02-09 VITALS — BP 122/78 | HR 71 | Ht 67.0 in | Wt 179.0 lb

## 2017-02-09 DIAGNOSIS — G8929 Other chronic pain: Secondary | ICD-10-CM | POA: Diagnosis not present

## 2017-02-09 DIAGNOSIS — M25562 Pain in left knee: Secondary | ICD-10-CM | POA: Diagnosis not present

## 2017-02-09 DIAGNOSIS — E119 Type 2 diabetes mellitus without complications: Secondary | ICD-10-CM | POA: Diagnosis not present

## 2017-02-09 DIAGNOSIS — M160 Bilateral primary osteoarthritis of hip: Secondary | ICD-10-CM | POA: Diagnosis not present

## 2017-02-09 DIAGNOSIS — M25561 Pain in right knee: Secondary | ICD-10-CM | POA: Diagnosis not present

## 2017-02-09 DIAGNOSIS — F17200 Nicotine dependence, unspecified, uncomplicated: Secondary | ICD-10-CM | POA: Diagnosis not present

## 2017-02-09 NOTE — Assessment & Plan Note (Signed)
Cautioned on increased blood sugars with corticosteroid injections.  We will continue to monitor.  In the future we will try to perform just one hip at a time however both are bothering him today and total steroid dose of 120mg  is acceptable.

## 2017-02-09 NOTE — Progress Notes (Signed)
[name] Joseluis Alessio, [place] at [place]  DENHAM MOSE - 71 y.o. male MRN [id number]  Date of birth: 1945-04-12  Visit Date: 02/09/2017  PCP: Marin Olp, MD   Referred by: Marin Olp, MD   Scribe for today's visit: Josepha Pigg, CMA    SUBJECTIVE:  Chelsea Primus is here for New Patient (Initial Visit) (osteoarthritis of bilateral hips); RT knee pain; and LT leg pain .  Referred by: Dr. Garret Reddish His bilateral hip pain, RT knee pain, LT leg pain sx symptoms INITIALLY: Began about 1 year ago in the LT leg and for several years for the RT knee.   Described as severe aching with occasional throbbing, LT leg pain is located just below the patella.  Worsened with bearing weight, going up stairs, sitting to standing.  Improved with rest Additional associated symptoms include: He denies swelling around the LT knee and in the LT leg. He ambulated with a cane. Over the past year he has noticed pain in his RT knee. RT knee pain is around the patella. He denies swelling around the RT knee. Pain is triggered by the same activities that cause the LT knee/leg pain but the RT knee pain is less severe.     At this time symptoms are worsening compared to onset, 6 months ago he did not need to walk around with a a cane.  He has been taking Aspirin 325 twice daily with some relief. He has not tried using compression, ice or heat on either.   Xray bilateral knees 01/28/16 Xray bilateral hips 06/13/16 Xray LT knee 06/02/16   ROS Denies night time disturbances. Denies fevers, chills, or night sweats. Denies unexplained weight loss. Reports personal history of bladder cancer about 15 yrs ago. Denies changes in bowel or bladder habits. Denies recent unreported falls. Denies new or worsening dyspnea or wheezing. Denies headaches or dizziness.  Reports numbness, tingling or weakness in the extremities, RT hand.  Denies  dizziness or presyncopal episodes Denies lower extremity edema     HISTORY & PERTINENT PRIOR DATA:  Prior History reviewed and updated per electronic medical record.  Significant history, findings, studies and interim changes include:  reports that he has been smoking cigars.  he has never used smokeless tobacco. Recent Labs    08/17/16 1056 01/19/17 1014  HGBA1C 7.0* 7.2*   No specialty comments available. Problem  Bilateral Primary Osteoarthritis of Hip   06/29/2016 - Bilateral Injection with 40mg  [facility] 02/09/17 - Bilateral injections with 40mg  Depo & 40mg  Kenalog on Left - 40mg  Depo Right with [name]   Bilateral Knee Pain   MRI left knee is remarkably normal.  Suspect this is coming from referred pain from his bilateral hip osteoarthritis   Diabetes Mellitus Type II, Controlled ([facility])   Metformin 1g BID, amaryl 2g  Goal a1c 7.5 with age, co morbidities, DNR      OBJECTIVE:  VS:  HT:5\' 7"  (170.2 cm)   WT:179 lb (81.2 kg)  BMI:28.03    BP:122/78  HR:71bpm  TEMP: ( )  RESP:97 %  PHYSICAL EXAM: Constitutional: WDWN, Non-toxic appearing. Psychiatric: Alert & appropriately interactive. Not depressed or anxious appearing. Respiratory: No increased work of breathing. Trachea Midline Eyes: Pupils are equal. EOM intact without nystagmus. No scleral icterus Cardiovascular:  Peripheral Pulses: peripheral pulses symmetrical No clubbing or cyanosis appreciated Capillary Refill is normal, less than 2 seconds No signficant generalized edema/anasarca Sensory  Exam: intact to light touch  Bilateral legs: Overall well aligned postsurgical changes along with large longitudinal medial incision from prior vein harvesting.  Well-healed.  No significant edema.  His bilateral knees are ligamentously stable.  He has full flexion extension.  He has pain with Stinchfield testing on the left compared to the right as well as weakness.  Marked limitations in internal and external rotation of  the left hip with pain with flexion.  Pain with FADIR and Corky Sox testing worse on the left than on the right.  Negative straight leg raise.  Lower extremity strength is 5/5 otherwise.  ASSESSMENT & PLAN:   1. Bilateral primary osteoarthritis of hip   2. Controlled type 2 diabetes mellitus without complication, without long-term current use of insulin ([place])   3. TOBACCO USE   4. Chronic pain of both knees    PLAN:    Bilateral primary osteoarthritis of hip Counseled that he is fairly end-stage disease on the left hip and that a total hip arthroplasty may end up being his treatment of choice however at this time we will plan to repeat his injections at his request and try to avoid surgical intervention.  We will plan to follow-up with him in 12 weeks and check on his progress at that time. Higher dose steroid to the left hip provided today and cautioned that this can increase his blood sugars.  Bilateral knee pain MRI left knee is remarkably normal.  Suspect this is coming from referred pain from his bilateral hip osteoarthritis  Diabetes mellitus type II, controlled ([place]) Cautioned on increased blood sugars with corticosteroid injections.  We will continue to monitor.  In the future we will try to perform just one hip at a time however both are bothering him today and total steroid dose of 120mg  is acceptable.   ++++++++++++++++++++++++++++++++++++++++++++ Orders & Meds: Orders Placed This Encounter  Procedures  . US GUIDED NEEDLE PLACEMENT(NO LINKED CHARGES)    No orders of the defined types were placed in this encounter.   ++++++++++++++++++++++++++++++++++++++++++++ Follow-up: Return in about 12 weeks (around 05/04/2017).   Pertinent documentation may be included in additional procedure notes, imaging studies, problem based documentation and patient instructions. Please see these sections of the encounter for additional information regarding this visit. CMA/ATC served as Education administrator  during this visit. History, Physical, and Plan performed by medical provider. Documentation and orders reviewed and attested to.      Gerda Diss, [place] [facility] Physician

## 2017-02-09 NOTE — Procedures (Signed)
PROCEDURE NOTE -  ULTRASOUND GUIDEDInjection: Bilateral hips Images were obtained and interpreted by myself, Teresa Coombs, DO  Images have been saved and stored to PACS system. Images obtained on: GE S7 Ultrasound machine  ULTRASOUND FINDINGS:  Acute angle, degenerative changes appreciated  DESCRIPTION OF PROCEDURE:  The patient's clinical condition is marked by substantial pain and/or significant functional disability. Other conservative therapy has not provided relief, is contraindicated, or not appropriate. There is a reasonable likelihood that injection will significantly improve the patient's pain and/or functional impairment.  After discussing the risks, benefits and expected outcomes of the injection and all questions were reviewed and answered, the patient wished to undergo the above named procedure. Verbal consent was obtained.  The ultrasound was used to identify the target structure and adjacent neurovascular structures. The skin was then prepped in sterile fashion and the target structure was injected under direct visualization using sterile technique as below:  Left PREP: Alcohol, Ethel Chloride,   APPROACH: direct, stopcock technique, 22g 3.5in. INJECTATE: 3cc 1% lidocaine, 2cc 0.5% marcaine, 1cc 40mg /mL DepoMedrol , 1 cc of 40 mg/mL Kenalog ASPIRATE: N/A DRESSING: Band-Aid  Right PREP: Alcohol, Ethel Chloride,   APPROACH: direct, stopcock technique, 22g 3.5in. INJECTATE: 4cc 1% lidocaine, 2cc 0.5% marcaine, 1cc 40mg /mL DepoMedrol ASPIRATE: N/A DRESSING: Band-Aid    Post procedural instructions including recommending icing and warning signs for infection were reviewed.  This procedure was well tolerated and there were no complications.   IMPRESSION: Succesful US Guided Injection bilateral hips

## 2017-02-09 NOTE — Patient Instructions (Signed)

## 2017-02-09 NOTE — Assessment & Plan Note (Signed)
MRI left knee is remarkably normal.  Suspect this is coming from referred pain from his bilateral hip osteoarthritis

## 2017-02-09 NOTE — Assessment & Plan Note (Addendum)
Counseled that he is fairly end-stage disease on the left hip and that a total hip arthroplasty may end up being his treatment of choice however at this time we will plan to repeat his injections at his request and try to avoid surgical intervention.  We will plan to follow-up with him in 12 weeks and check on his progress at that time. Higher dose steroid to the left hip provided today and cautioned that this can increase his blood sugars.

## 2017-02-17 NOTE — Progress Notes (Signed)
Cardiology Office Note:    Date:  02/20/2017   ID:  Benjamin Browning, DOB 1945-04-29, MRN [id number]  PCP:  Marin Olp, MD  Cardiologist:  Shirlee More, MD   Referring MD: Marin Olp, MD  ASSESSMENT:    1. Coronary artery disease of native heart with stable angina pectoris, unspecified vessel or lesion type ([place])   2. Essential hypertension   3. Hyperlipidemia, unspecified hyperlipidemia type    PLAN:    In order of problems listed above:  1. Clinically stable we will continue his current medical treatment in the absence of a change of symptoms follow-up in the office in 1 year.  If he undergoes elective total hip replacement he need a preoperative ischemia evaluation.  He was counseled regarding cessation of cigar smoking he is not ready to quit.  At this time he does not want undergo an ischemia evaluation. 2. Stable continue current treatment ACE inhibitor 3. Stable continue treatment with a high intensity statin I reviewed his lipids I am pleased with his targets.  Next appointment 1 yr   Medication Adjustments/Labs and Tests Ordered: Current medicines are reviewed at length with the patient today.  Concerns regarding medicines are outlined above.  Orders Placed This Encounter  Procedures  . EKG 12-Lead   Meds ordered this encounter  Medications  . DISCONTD: nitroGLYCERIN (NITROSTAT) SL tablet 0.3 mg  . nitroGLYCERIN (NITROSTAT) 0.4 MG SL tablet    Sig: Place 1 tablet (0.4 mg total) under the tongue every 5 (five) minutes as needed for chest pain.    Dispense:  25 tablet    Refill:  11     Chief Complaint  Patient presents with  . Establish Care    Hx of heart attacks  . Coronary Artery Disease    History of Present Illness:    Benjamin Browning is a 71 y.o. male who is being seen today for the evaluation of CAD with CABG in 1991  PCI and stent in 2005 at the request of Marin Olp, MD.he was last seen by Dr Percival Spanish in 2013.  Overall he is  pleased with the quality of his life is quite sedentary because of hip pain and he continues to smoke cigars with the knowledge that this is adverse for his health.  He has a long history of intermittent once every 1-16-month brief aching in his left chest not exertional not relieved with rest lasting a few minutes does not have nitroglycerin.  He does not have a pattern of typical angina.  Within his activities he is not short of breath no palpitations syncope or TIA.  He may require hip surgery and understands he need a cardiac evaluation for ischemia prior to that but at this point time does not want to do diagnostic cardiac testing.  He was given a prescription for nitroglycerin that he can use advised to see me if he has a change in his symptoms and will come back to see me in the office in 1 year.  He inquired about screening for other vascular disease and I encouraged him to have a [facility] screening at it as at this time he is asymptomatic.  He will continue his current dose of aspirin he does not want to decrease he says he is taken since 48 has not not had a problem.  He is compliant with his antihypertensives and lipid-lowering therapy.   Past Medical History:  Diagnosis Date  . CAD (coronary artery disease)   .  Cerebrovascular accident ([place]) 4/01  . Diabetes mellitus 2005   type 2  . Heart attack ([place]) 2005   w/ Taxus stenting to a native vessel   . Heart attack ([place]) 1991  . Hyperlipidemia   . Positive PPD     Past Surgical History:  Procedure Laterality Date  . APPENDECTOMY     as child  . CORONARY ARTERY BYPASS GRAFT  1991  . INGUINAL HERNIA REPAIR  1985    Current Medications: Current Meds  Medication Sig  . amLODipine (NORVASC) 10 MG tablet TAKE 1 TABLET DAILY  . aspirin 325 MG EC tablet Take 650 mg by mouth daily.   Marland Kitchen atorvastatin (LIPITOR) 20 MG tablet TAKE 1 TABLET DAILY  . enalapril (VASOTEC) 20 MG tablet TAKE 1 TABLET TWICE A DAY  . glimepiride (AMARYL) 2 MG tablet  Take 1 tablet (2 mg total) by mouth every morning.  . metFORMIN (GLUCOPHAGE) 1000 MG tablet TAKE 1 TABLET TWICE A DAY  . metoprolol (LOPRESSOR) 50 MG tablet TAKE 1 TABLET TWICE A DAY     Allergies:   Patient has no known allergies.   Social History   Socioeconomic History  . Marital status: Married    Spouse name: None  . Number of children: None  . Years of education: None  . Highest education level: None  Social Needs  . Financial resource strain: None  . Food insecurity - worry: None  . Food insecurity - inability: None  . Transportation needs - medical: None  . Transportation needs - non-medical: None  Occupational History  . None  Tobacco Use  . Smoking status: Current Every Day Smoker    Types: Cigars  . Smokeless tobacco: Never Used  . Tobacco comment: 2 cigars per day/w down to one a day   Substance and Sexual Activity  . Alcohol use: Yes    Alcohol/week: 0.0 oz    Comment: 2 beers per month  . Drug use: No  . Sexual activity: None  Other Topics Concern  . None  Social History Narrative   Separated. 3 kids. 10 grandkids.       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       Hobbies: reading, used to play a lot of golf     Family History: The patient's family history includes Hodgkin's lymphoma in his father.  ROS:   ROS Please see the history of present illness.     All other systems reviewed and are negative.  EKGs/Labs/Other Studies Reviewed:    The following studies were reviewed today:   EKG:  EKG is  ordered today.  The ekg ordered today demonstrates sinus rhythm left axis deviation 1 PVC  Recent Labs: 01/19/2017: ALT 14; BUN 20; Creatinine, Ser 1.18; Hemoglobin 14.5; Platelets 336.0; Potassium 4.2; Sodium 139  Recent Lipid Panel    Component Value Date/Time   CHOL 126 01/19/2017 1014   TRIG 154.0 (H) 01/19/2017 1014   TRIG 124 12/01/2005 1115   HDL 34.90 (L) 01/19/2017 1014   CHOLHDL 4 01/19/2017 1014   VLDL 30.8 01/19/2017 1014    LDLCALC 60 01/19/2017 1014   LDLDIRECT 66.0 01/28/2016 0943    Physical Exam:    VS:  BP 120/74 (BP Location: Right Arm, Patient Position: Sitting, Cuff Size: Normal)   Pulse 81   Ht 5\' 7"  (1.702 m)   Wt 175 lb (79.4 kg)   SpO2 98%   BMI 27.41 kg/m     Wt Readings from Last  3 Encounters:  02/20/17 175 lb (79.4 kg)  02/09/17 179 lb (81.2 kg)  01/19/17 180 lb 3.2 oz (81.7 kg)     GEN:  Well nourished, well developed in no acute distress HEENT: Normal NECK: No JVD; No carotid bruits LYMPHATICS: No lymphadenopathy CARDIAC: RRR, no murmurs, rubs, gallops RESPIRATORY:  Clear to auscultation without rales, wheezing or rhonchi  ABDOMEN: Soft, non-tender, non-distended MUSCULOSKELETAL:  No edema; No deformity  SKIN: Warm and dry NEUROLOGIC:  Alert and oriented x 3 PSYCHIATRIC:  Normal affect     Signed, Shirlee More, MD  02/20/2017 9:30 AM    [place]

## 2017-02-20 ENCOUNTER — Ambulatory Visit (INDEPENDENT_AMBULATORY_CARE_PROVIDER_SITE_OTHER): Payer: Medicare Other | Admitting: Cardiology

## 2017-02-20 ENCOUNTER — Encounter: Payer: Self-pay | Admitting: Cardiology

## 2017-02-20 VITALS — BP 120/74 | HR 81 | Ht 67.0 in | Wt 175.0 lb

## 2017-02-20 DIAGNOSIS — I10 Essential (primary) hypertension: Secondary | ICD-10-CM | POA: Diagnosis not present

## 2017-02-20 DIAGNOSIS — E785 Hyperlipidemia, unspecified: Secondary | ICD-10-CM

## 2017-02-20 DIAGNOSIS — I25118 Atherosclerotic heart disease of native coronary artery with other forms of angina pectoris: Secondary | ICD-10-CM | POA: Diagnosis not present

## 2017-02-20 MED ORDER — NITROGLYCERIN 0.4 MG SL SUBL: 0.4000 mg | SUBLINGUAL_TABLET | SUBLINGUAL | 11 refills | Status: AC | PRN

## 2017-02-20 MED ORDER — NITROGLYCERIN 0.3 MG SL SUBL: 0.4000 mg | SUBLINGUAL_TABLET | SUBLINGUAL | Status: DC | PRN

## 2017-02-20 NOTE — Patient Instructions (Addendum)
Medication Instructions:  Your physician recommends that you continue on your current medications as directed. Please refer to the Current Medication list given to you today.  Labwork: None  Testing/Procedures: You had an EKG today.  Follow-Up: Your physician wants you to follow-up in: 1 year. You will receive a reminder letter in the mail two months in advance. If you don't receive a letter, please call our office to schedule the follow-up appointment.  Any Other Special Instructions Will Be Listed Below (If Applicable).     If you need a refill on your cardiac medications before your next appointment, please call your pharmacy.    Consider a Life Line Screening

## 2017-05-04 ENCOUNTER — Encounter: Payer: Self-pay | Admitting: Sports Medicine

## 2017-05-04 ENCOUNTER — Ambulatory Visit (INDEPENDENT_AMBULATORY_CARE_PROVIDER_SITE_OTHER): Payer: Medicare Other | Admitting: Sports Medicine

## 2017-05-04 VITALS — BP 122/84 | HR 74 | Ht 67.0 in | Wt 177.0 lb

## 2017-05-04 DIAGNOSIS — M160 Bilateral primary osteoarthritis of hip: Secondary | ICD-10-CM | POA: Diagnosis not present

## 2017-05-04 DIAGNOSIS — M25562 Pain in left knee: Secondary | ICD-10-CM | POA: Diagnosis not present

## 2017-05-04 DIAGNOSIS — F17200 Nicotine dependence, unspecified, uncomplicated: Secondary | ICD-10-CM

## 2017-05-04 DIAGNOSIS — G8929 Other chronic pain: Secondary | ICD-10-CM | POA: Diagnosis not present

## 2017-05-04 DIAGNOSIS — M25561 Pain in right knee: Secondary | ICD-10-CM | POA: Diagnosis not present

## 2017-05-04 DIAGNOSIS — Z8551 Personal history of malignant neoplasm of bladder: Secondary | ICD-10-CM

## 2017-05-04 NOTE — Progress Notes (Signed)
Benjamin Browning. Benjamin Browning, [facility]  Benjamin Browning - 72 y.o. male MRN [id number]  Date of birth: 27-Feb-1945  Visit Date: 05/04/2017  PCP: Marin Olp, MD   Referred by: Marin Olp, MD   Scribe for today's visit: Josepha Pigg, CMA     SUBJECTIVE:  Benjamin Browning is here for Follow-up (osteoarthritis bilateral hips) .  Referred by: Dr. Garret Reddish   Please see additional documentation for Objective, Assessment and Plan sections. Pertinent additional documentation may be included in corresponding procedure notes, imaging studies, problem based documentation and patient instructions. Please see these sections of the encounter for additional information regarding this visit.  CMA/ATC served as Education administrator during this visit. History, Physical, and Plan performed by medical provider. Documentation and orders reviewed and attested to.      Gerda Diss, [facility] Physician   02/09/17: His bilateral hip pain, RT knee pain, LT leg pain sx symptoms INITIALLY: Began about 1 year ago in the LT leg and for several years for the RT knee.   Described as severe aching with occasional throbbing, LT leg pain is located just below the patella.  Worsened with bearing weight, going up stairs, sitting to standing.  Improved with rest Additional associated symptoms include: He denies swelling around the LT knee and in the LT leg. He ambulated with a cane. Over the past year he has noticed pain in his RT knee. RT knee pain is around the patella. He denies swelling around the RT knee. Pain is triggered by the same activities that cause the LT knee/leg pain but the RT knee pain is less severe.  At this time symptoms are worsening compared to onset, 6 months ago he did not need to walk around with a a cane.  He has been taking Aspirin 325 twice daily with some relief. He has not tried using compression, ice or  heat on either.  Xray bilateral knees 01/28/16 Xray bilateral hips 06/13/16 Xray LT knee 06/02/16  05/04/17: Compared to the last office visit, his previously described symptoms show no change, still having L hip pain. R hip pain has improved. He feels the pain more in the L knee than the L hip. He denies pain in the gluteal region or groin.  Current symptoms are moderate & are radiates to the L knee. Occasionally he will have pain in the L leg and foot. He denies swelling. He is ambulating with a cane today.  He has been taking Aspirin 235 BID for pain with some relief.  He received steroid injection into both hips at last OV with good relief.    ROS Denies night time disturbances. Denies fevers, chills, or night sweats. Denies unexplained weight loss. Reports personal history of cancer, bladder. Denies changes in bowel or bladder habits. Reports recent unreported falls. He reports losing balance a couple of time since he was last seen.  Denies new or worsening dyspnea or wheezing. Denies headaches or dizziness.  Reports numbness, tingling or weakness in fingers Denies dizziness or presyncopal episodes Denies lower extremity edema    HISTORY & PERTINENT PRIOR DATA:  Prior History reviewed and updated per electronic medical record.  Significant history, findings, studies and interim changes include:  reports that he has been smoking cigars.  he has never used smokeless tobacco. Recent Labs    08/17/16 1056 01/19/17 1014  HGBA1C 7.0* 7.2*   No  specialty comments available. No problems updated.     Please see additional documentation for Objective, Assessment and Plan sections. Pertinent additional documentation may be included in corresponding procedure notes, imaging studies, problem based documentation and patient instructions. Please see these sections of the encounter for additional information regarding this visit.  CMA/ATC served as Education administrator during this visit. History,  Physical, and Plan performed by medical provider. Documentation and orders reviewed and attested to.      Gerda Diss, [facility] Physician

## 2017-05-04 NOTE — Assessment & Plan Note (Signed)
His right hip is doing fairly well today but he does have a small amount of improved range of motion in the right compared to the left which does correlate with the degree of his symptoms.  He did get moderate relief with the most recent injection for his right hip and is mainly only having limitations from his left hip.  Is not interested in total hip arthroplasty although we did discuss that this would be an appropriate treatment.  If you would like to repeat injections at any time in the future he will call to schedule follow-up.

## 2017-05-04 NOTE — Progress Notes (Signed)
  OBJECTIVE:  VS:  HT:5\' 7"  (170.2 cm)   WT:177 lb (80.3 kg)  BMI:27.72    BP:122/84  HR:74bpm  TEMP: ( )  RESP:98 %   PHYSICAL EXAM: WDWN, Non-toxic appearing. Psychiatric: Alert & appropriately interactive.  Not depressed or anxious appearing. Respiratory: No increased work of breathing.  Trachea Midline Eyes: Pupils are equal.  EOM intact without nystagmus.  No scleral icterus  NEUROVASCULAR exam: No clubbing or cyanosis appreciated No significant venous stasis changes normal, less than 2 seconds   B Hips:  Walks with a coxalgia gait.  Using a cane.  Moderate pain with Corky Sox testing and FADIR.  Pain with Stinchfield testing.  Minimal pain over the greater trochanters. R with 40 degrees of arc L with 15 degrees of arc  ASSESSMENT & PLAN:   1. Chronic pain of left knee   2. Bilateral primary osteoarthritis of hip   3. Tobacco use disorder   4. Chronic pain of both knees   5. History of bladder cancer    Orders & Meds: No orders of the defined types were placed in this encounter.  No orders of the defined types were placed in this encounter.   PLAN:   Bilateral primary osteoarthritis of hip His right hip is doing fairly well today but he does have a small amount of improved range of motion in the right compared to the left which does correlate with the degree of his symptoms.  He did get moderate relief with the most recent injection for his right hip and is mainly only having limitations from his left hip.  Is not interested in total hip arthroplasty although we did discuss that this would be an appropriate treatment.  If you would like to repeat injections at any time in the future he will call to schedule follow-up.     Follow-up: Return for as needed for ongoing issues.

## 2017-05-13 ENCOUNTER — Other Ambulatory Visit: Payer: Self-pay | Admitting: Family Medicine

## 2017-06-30 ENCOUNTER — Other Ambulatory Visit: Payer: Self-pay | Admitting: Family Medicine

## 2017-06-30 NOTE — Telephone Encounter (Signed)
You may fill this- I had on last note that he was taking

## 2017-06-30 NOTE — Telephone Encounter (Signed)
Epic shows that this was last filled in 2016, OK to refill? Last OV 01/19/17.

## 2017-07-21 ENCOUNTER — Ambulatory Visit: Payer: Medicare Other | Admitting: *Deleted

## 2017-07-21 ENCOUNTER — Encounter: Payer: Self-pay | Admitting: Family Medicine

## 2017-07-21 ENCOUNTER — Ambulatory Visit (INDEPENDENT_AMBULATORY_CARE_PROVIDER_SITE_OTHER): Payer: Medicare Other | Admitting: Family Medicine

## 2017-07-21 VITALS — BP 118/78 | HR 70 | Temp 97.6°F | Ht 67.0 in | Wt 177.0 lb

## 2017-07-21 DIAGNOSIS — Z0000 Encounter for general adult medical examination without abnormal findings: Secondary | ICD-10-CM | POA: Diagnosis not present

## 2017-07-21 DIAGNOSIS — E785 Hyperlipidemia, unspecified: Secondary | ICD-10-CM

## 2017-07-21 DIAGNOSIS — E119 Type 2 diabetes mellitus without complications: Secondary | ICD-10-CM | POA: Diagnosis not present

## 2017-07-21 DIAGNOSIS — I2510 Atherosclerotic heart disease of native coronary artery without angina pectoris: Secondary | ICD-10-CM | POA: Diagnosis not present

## 2017-07-21 DIAGNOSIS — F17200 Nicotine dependence, unspecified, uncomplicated: Secondary | ICD-10-CM

## 2017-07-21 DIAGNOSIS — I10 Essential (primary) hypertension: Secondary | ICD-10-CM

## 2017-07-21 LAB — COMPREHENSIVE METABOLIC PANEL
ALK PHOS: 80 U/L (ref 39–117)
ALT: 10 U/L (ref 0–53)
AST: 10 U/L (ref 0–37)
Albumin: 4.6 g/dL (ref 3.5–5.2)
BUN: 20 mg/dL (ref 6–23)
CO2: 20 meq/L (ref 19–32)
Calcium: 10 mg/dL (ref 8.4–10.5)
Chloride: 105 mEq/L (ref 96–112)
Creatinine, Ser: 1.07 mg/dL (ref 0.40–1.50)
GFR: 72 mL/min (ref >60.00)
GLUCOSE: 108 mg/dL — AB (ref 70–99)
POTASSIUM: 4.6 meq/L (ref 3.5–5.1)
SODIUM: 139 meq/L (ref 135–145)
TOTAL PROTEIN: 8 g/dL (ref 6.0–8.3)
Total Bilirubin: 0.4 mg/dL (ref 0.2–1.2)

## 2017-07-21 LAB — CBC WITH DIFFERENTIAL/PLATELET
Basophils Absolute: 0 10*3/uL (ref 0.0–0.1)
Basophils Relative: 0.4 % (ref 0.0–3.0)
Eosinophils Absolute: 0.1 10*3/uL (ref 0.0–0.7)
Eosinophils Relative: 1.1 % (ref 0.0–5.0)
HCT: 43 % (ref 39.0–52.0)
Hemoglobin: 14.3 g/dL (ref 13.0–17.0)
LYMPHS ABS: 3.1 10*3/uL (ref 0.7–4.0)
Lymphocytes Relative: 30.1 % (ref 12.0–46.0)
MCHC: 33.3 g/dL (ref 30.0–36.0)
MCV: 89.8 fl (ref 78.0–100.0)
MONO ABS: 0.8 10*3/uL (ref 0.1–1.0)
Monocytes Relative: 7.6 % (ref 3.0–12.0)
NEUTROS ABS: 6.3 10*3/uL (ref 1.4–7.7)
NEUTROS PCT: 60.8 % (ref 43.0–77.0)
PLATELETS: 324 10*3/uL (ref 150.0–400.0)
RBC: 4.78 Mil/uL (ref 4.22–5.81)
RDW: 13.1 % (ref 11.5–15.5)
WBC: 10.4 10*3/uL (ref 4.0–10.5)

## 2017-07-21 LAB — LDL CHOLESTEROL, DIRECT: Direct LDL: 63 mg/dL

## 2017-07-21 LAB — HEMOGLOBIN A1C: HEMOGLOBIN A1C: 7 % — AB (ref 4.6–6.5)

## 2017-07-21 NOTE — Patient Instructions (Addendum)
Health Maintenance Due  Topic Date Due  . TETANUS/TDAP - Informed patient to have it done at his pharmacy. 12/23/2015  . OPHTHALMOLOGY EXAM - We will call you within two weeks about your referral to the eye doctor. If you do not hear within 3 weeks, give [place] a call.   08/19/2016  . HEMOGLOBIN A1C - today with blood work. 07/19/2017   Please check with your pharmacy to see if they have the shingrix vaccine. If they do- please get this immunization and update [place] by phone call or mychart with dates you receive the vaccine  Please stop by lab before you go   Benjamin Browning , Thank you for taking time to come for your Medicare Wellness Visit. I appreciate your ongoing commitment to your health goals. Please review the following plan we discussed and let me know if I can assist you in the future.   These are the goals we discussed: 1. Focus on healthy diet- try to exercise some each day 2. a1c goal 7 or less. Increase amaryl to 4mg  if nto at goal   This is a list of the screening recommended for you and due dates:  Health Maintenance  Topic Date Due  . Tetanus Vaccine  12/23/2015  . Eye exam for diabetics  08/19/2016  . Hemoglobin A1C  07/19/2017  . Complete foot exam   08/17/2017  . Flu Shot  09/21/2017  . Colon Cancer Screening  05/20/2018  .  Hepatitis C: One time screening is recommended by Center for Disease Control  (CDC) for  adults born from [birth year] through 1966.   Completed  . Pneumonia vaccines  Completed

## 2017-07-21 NOTE — Assessment & Plan Note (Signed)
S:  controlled on atorvastatin 20 mg with LDL of 60 in November. Still on aspirin.  A/P: Continue current medicines

## 2017-07-21 NOTE — Assessment & Plan Note (Signed)
Last visit agreed to cardiology referral- Saw Dr. Bettina Gavia- no updated ischemia evaluation per patient preference unless he has need for surgery.

## 2017-07-21 NOTE — Assessment & Plan Note (Signed)
S: controlled on enalapril 20 mg, metoprolol 50 mg twice daily, amlodipine 10 mg BP Readings from Last 3 Encounters:  07/21/17 118/78  05/04/17 122/84  02/20/17 120/74  A/P: We discussed blood pressure goal of <140/90. Continue current meds

## 2017-07-21 NOTE — Assessment & Plan Note (Signed)
S: Mild poorly controlled on metformin 1 g twice daily, Amaryl 2 mg once a day.  Does not check his CBGs Lab Results  Component Value Date   HGBA1C 7.2 (H) 01/19/2017   HGBA1C 7.0 (H) 08/17/2016   HGBA1C 7.0 (H) 01/28/2016   A/P: update a1c today. Prefer a1c under 7 if possible. He would prefer to increase amaryl despite better options out there- due to expense concern

## 2017-07-21 NOTE — Progress Notes (Signed)
Subjective:  Benjamin Browning is a 73 y.o. year old very pleasant male patient who presents for/with See problem oriented charting ROS- No chest pain or shortness of breath. No headache.  No edema . Some runny nose, watery eyes  Past Medical History-  Patient Active Problem List   Diagnosis Date Noted  . DNR (do not resuscitate) 12/31/2013    Priority: High  . TOBACCO USE 03/17/2008    Priority: High  . Diabetes mellitus type II, controlled ([place]) 08/21/2006    Priority: High  . CAD (coronary artery disease) 08/21/2006    Priority: High  . Hyperlipidemia 12/31/2013    Priority: Medium  . Essential hypertension 01/22/2007    Priority: Medium  . History of bladder cancer 08/21/2006    Priority: Medium  . History of CVA (cerebrovascular accident) 08/21/2006    Priority: Medium  . Bilateral primary osteoarthritis of hip 06/29/2016  . Bilateral knee pain 07/26/2012    Medications- reviewed and updated Current Outpatient Medications  Medication Sig Dispense Refill  . amLODipine (NORVASC) 10 MG tablet TAKE 1 TABLET DAILY 90 tablet 3  . aspirin 325 MG EC tablet Take 650 mg by mouth daily.     Marland Kitchen atorvastatin (LIPITOR) 20 MG tablet TAKE 1 TABLET DAILY 90 tablet 3  . enalapril (VASOTEC) 20 MG tablet TAKE 1 TABLET TWICE A DAY 180 tablet 3  . glimepiride (AMARYL) 2 MG tablet TAKE 1 TABLET EVERY MORNING 90 tablet 1  . metFORMIN (GLUCOPHAGE) 1000 MG tablet TAKE 1 TABLET TWICE A DAY 180 tablet 5  . metoprolol tartrate (LOPRESSOR) 50 MG tablet TAKE 1 TABLET TWICE A DAY 180 tablet 3  . nitroGLYCERIN (NITROSTAT) 0.4 MG SL tablet Place 1 tablet (0.4 mg total) under the tongue every 5 (five) minutes as needed for chest pain. 25 tablet 11   No current facility-administered medications for this visit.     Objective: BP 118/78 (BP Location: Left Arm, Patient Position: Sitting, Cuff Size: Normal)   Pulse 70   Temp 97.6 F (36.4 C) (Oral)   Ht 5\' 7"  (1.702 m)   Wt 177 lb (80.3 kg)   SpO2 97%    BMI 27.72 kg/m  Gen: NAD, resting comfortably CV: RRR no murmurs rubs or gallops Lungs: CTAB no crackles, wheeze, rhonchi Abdomen: soft/nontender/nondistended/normal bowel sounds. No rebound or guarding.  Ext: no edema Skin: warm, dry Neuro: Walks with cane, normal speech  Assessment/Plan:  Other notes: 1. Bilateral hip arthritis-referred to sports medicine at last visit  Diabetes mellitus type II, controlled ([place]) S: Mild poorly controlled on metformin 1 g twice daily, Amaryl 2 mg once a day.  Does not check his CBGs Lab Results  Component Value Date   HGBA1C 7.2 (H) 01/19/2017   HGBA1C 7.0 (H) 08/17/2016   HGBA1C 7.0 (H) 01/28/2016   A/P: update a1c today. Prefer a1c under 7 if possible. He would prefer to increase amaryl despite better options out there- due to expense concern  Hyperlipidemia S:  controlled on atorvastatin 20 mg with LDL of 60 in November. Still on aspirin.  A/P: Continue current medicines  Essential hypertension S: controlled on enalapril 20 mg, metoprolol 50 mg twice daily, amlodipine 10 mg BP Readings from Last 3 Encounters:  07/21/17 118/78  05/04/17 122/84  02/20/17 120/74  A/P: We discussed blood pressure goal of <140/90. Continue current meds  CAD (coronary artery disease) Last visit agreed to cardiology referral- Saw Dr. Bettina Gavia- no updated ischemia evaluation per patient preference unless he has  need for surgery.  TOBACCO USE Once again declines stopping cigars (one a day).  Encourage cessation   Future Appointments  Date Time Provider [place]  07/21/2017  9:00 AM Marin Olp, MD [facility]  07/25/2017  9:20 AM Gerda Diss, DO [facility]   6 months follow up  Lab/Order associations: Controlled type 2 diabetes mellitus without complication, without long-term current use of insulin ([place]) - Plan: LDL cholesterol, direct, Hemoglobin A1c, Comprehensive metabolic panel, CBC with Differential/Platelet  Hyperlipidemia,  unspecified hyperlipidemia type - Plan: LDL cholesterol, direct, Comprehensive metabolic panel, CBC with Differential/Platelet  Essential hypertension - Plan: Comprehensive metabolic panel, CBC with Differential/Platelet  Return precautions advised.  Garret Reddish, MD

## 2017-07-21 NOTE — Progress Notes (Signed)
Phone: [phone]  Subjective:  Patient presents today for their annual wellness visit.    Preventive Screening-Counseling & Management  Smoking Status: Current cigar smoker-not ready to quit Second Hand Smoking status: No smokers in home  Risk Factors Regular exercise: none Diet: Reasonably balanced Fall Risk: None  Fall Risk  07/21/2017 05/18/2016 04/20/2016 04/07/2016 01/28/2016  Falls in the past year? No No No Yes No  Comment - - - - Emmi Telephone Survey: data to providers prior to load  Number falls in past yr: - - - 1 -  Risk for fall due to : - Other (Comment) Other (Comment) - -  Follow up - - - Education provided -   Opioid use history:  no long term opioids use  Cardiac risk factors:  advanced age (older than 18 for men, 17 for women)  KNOWN CAD Hyperlipidemia yes but controlled Diabetes-mild poor control last check Hypertension-controlled   Depression Screen None. PHQ2 0  Depression screen [facility] 2/9 07/21/2017 05/18/2016 04/20/2016 04/07/2016 01/29/2015  Decreased Interest 0 0 0 3 0  Down, Depressed, Hopeless 0 0 0 - 0  PHQ - 2 Score 0 0 0 3 0  Altered sleeping - 0 0 0 -  Tired, decreased energy - 0 0 1 -  Change in appetite - 0 0 1 -  Feeling bad or failure about yourself  - 0 0 0 -  Trouble concentrating - 0 0 (No Data) -  Moving slowly or fidgety/restless - 0 0 0 -  Suicidal thoughts - 0 0 0 -  PHQ-9 Score - 0 0 5 -  Difficult doing work/chores - - Not difficult at all Somewhat difficult -    Activities of Daily Living Independent ADLs and IADLs   Hearing Difficulties: -patient declines  Cognitive Testing No reported trouble.   Normal 3 word recall with distraction  List the Names of Other Physician/Practitioners you currently use: -Dr. [name] of sports medicine -Dr. Bettina Gavia of cardiology  Immunization History  Administered Date(s) Administered  . Influenza Whole 11/17/2008, 11/21/2009, 01/03/2012  . Influenza, High Dose Seasonal PF  10/30/2014, 01/28/2016, 01/19/2017  . Influenza,inj,Quad PF,6+ Mos 12/31/2013  . Influenza-Unspecified 10/22/2012  . Pneumococcal Conjugate-13 06/27/2013  . Pneumococcal Polysaccharide-23 11/05/2007, 10/30/2014  . Td 12/22/2005  . Zoster 01/17/2011   Required Immunizations needed today -Tdap and shingrix discussed.  We discussed shingrix availability issues  as well as coverage issues (part D medicare)- I recommended that patient get vaccine at the pharmacy   Screening tests- up to date Health Maintenance Due  Topic Date Due  . TETANUS/TDAP - Informed patient to have it done at his pharmacy. 12/23/2015  . OPHTHALMOLOGY EXAM - Patient needs to schedule his eye exam. 08/19/2016  . HEMOGLOBIN A1C - Patient would like it done with blood work. 07/19/2017  Will be due for colonoscopy 04/2018- he is not sure he would want to do that. Could consider cologuard for final screen.   Former smoker- but quit over 15 years ago so doesn't qualify for lung cancer screening.  - we will plan on urine next visit- he will get hydrated before hand - as far as AAA screen (had CT abd/pelvis with no signs aneurysm 2001- he doesn't want to repeat AAA screen  ROS- No pertinent positives discovered in course of AWV. See ROS for problem oriented visit  The following were reviewed and entered/updated in epic: Past Medical History:  Diagnosis Date  . CAD (coronary artery disease)   . Cerebrovascular accident [facility])  4/01  . Diabetes mellitus 2005   type 2  . Heart attack ([place]) 2005   w/ Taxus stenting to a native vessel   . Heart attack ([place]) 1991  . Hyperlipidemia   . Positive PPD    Patient Active Problem List   Diagnosis Date Noted  . DNR (do not resuscitate) 12/31/2013    Priority: High  . TOBACCO USE 03/17/2008    Priority: High  . Diabetes mellitus type II, controlled ([place]) 08/21/2006    Priority: High  . CAD (coronary artery disease) 08/21/2006    Priority: High  . Hyperlipidemia 12/31/2013     Priority: Medium  . Essential hypertension 01/22/2007    Priority: Medium  . History of bladder cancer 08/21/2006    Priority: Medium  . History of CVA (cerebrovascular accident) 08/21/2006    Priority: Medium  . Bilateral primary osteoarthritis of hip 06/29/2016  . Bilateral knee pain 07/26/2012   Past Surgical History:  Procedure Laterality Date  . APPENDECTOMY     as child  . CORONARY ARTERY BYPASS GRAFT  1991  . INGUINAL HERNIA REPAIR  1985    Family History  Problem Relation Age of Onset  . Hodgkin's lymphoma Father        died in 52s    Medications- reviewed and updated Current Outpatient Medications  Medication Sig Dispense Refill  . amLODipine (NORVASC) 10 MG tablet TAKE 1 TABLET DAILY 90 tablet 3  . aspirin 325 MG EC tablet Take 650 mg by mouth daily.     Marland Kitchen atorvastatin (LIPITOR) 20 MG tablet TAKE 1 TABLET DAILY 90 tablet 3  . enalapril (VASOTEC) 20 MG tablet TAKE 1 TABLET TWICE A DAY 180 tablet 3  . glimepiride (AMARYL) 2 MG tablet TAKE 1 TABLET EVERY MORNING 90 tablet 1  . metFORMIN (GLUCOPHAGE) 1000 MG tablet TAKE 1 TABLET TWICE A DAY 180 tablet 5  . metoprolol tartrate (LOPRESSOR) 50 MG tablet TAKE 1 TABLET TWICE A DAY 180 tablet 3  . nitroGLYCERIN (NITROSTAT) 0.4 MG SL tablet Place 1 tablet (0.4 mg total) under the tongue every 5 (five) minutes as needed for chest pain. 25 tablet 11   No current facility-administered medications for this visit.     Allergies-reviewed and updated No Known Allergies  Social History   Socioeconomic History  . Marital status: Married    Spouse name: Not on file  . Number of children: Not on file  . Years of education: Not on file  . Highest education level: Not on file  Occupational History  . Not on file  Social Needs  . Financial resource strain: Not on file  . Food insecurity:    Worry: Not on file    Inability: Not on file  . Transportation needs:    Medical: Not on file    Non-medical: Not on file  Tobacco  Use  . Smoking status: Current Every Day Smoker    Types: Cigars  . Smokeless tobacco: Never Used  . Tobacco comment: 2 cigars per day/w down to one a day   Substance and Sexual Activity  . Alcohol use: Yes    Alcohol/week: 0.0 oz    Comment: 2 beers per month  . Drug use: No  . Sexual activity: Not on file  Lifestyle  . Physical activity:    Days per week: Not on file    Minutes per session: Not on file  . Stress: Not on file  Relationships  . Social connections:  Talks on phone: Not on file    Gets together: Not on file    Attends religious service: Not on file    Active member of club or organization: Not on file    Attends meetings of clubs or organizations: Not on file    Relationship status: Not on file  Other Topics Concern  . Not on file  Social History Narrative   Separated. 3 kids. 10 grandkids.       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       Hobbies: reading, used to play a lot of golf    Objective: BP 118/78 (BP Location: Left Arm, Patient Position: Sitting, Cuff Size: Normal)   Pulse 70   Temp 97.6 F (36.4 C) (Oral)   Ht 5\' 7"  (1.702 m)   Wt 177 lb (80.3 kg)   SpO2 97%   BMI 27.72 kg/m  Gen: NAD, resting comfortably HEENT: Mucous membranes are moist. Oropharynx normal Neck: no thyromegaly CV: RRR no murmurs rubs or gallops Lungs: CTAB no crackles, wheeze, rhonchi Abdomen: soft/nontender/nondistended/normal bowel sounds. No rebound or guarding.  Ext: no edema Skin: warm, dry Neuro: grossly normal, moves all extremities, PERRLA  Assessment/Plan:  AWV completed- discussed recommended screenings anddocumented any personalized health advice and referrals for preventive counseling. See AVS as well which was given to patient.   Status of chronic or acute concerns  See separate problem-oriented visit  Future Appointments  Date Time Provider [place]  07/21/2017  9:00 AM Marin Olp, MD [facility]  07/25/2017  9:20 AM  Gerda Diss, DO [facility]   Return precautions advised.  Garret Reddish, MD

## 2017-07-21 NOTE — Assessment & Plan Note (Signed)
Once again declines stopping cigars (one a day).  Encourage cessation

## 2017-07-22 NOTE — Progress Notes (Signed)
Your hemoglobin A1c was well controlled at 7.0.  Continue current medicines. Your CBC was normal (blood counts, infection fighting cells, platelets).  Your white blood cells which had been high the last few visits were normal on this check. Your CMET was normal (kidney, liver, and electrolytes, blood sugar) for someone with diabetes. Your cholesterol  is well controlled with bad cholesterol under 70.

## 2017-07-23 ENCOUNTER — Encounter: Payer: Self-pay | Admitting: Family Medicine

## 2017-07-25 ENCOUNTER — Encounter: Payer: Self-pay | Admitting: Sports Medicine

## 2017-07-25 ENCOUNTER — Ambulatory Visit (INDEPENDENT_AMBULATORY_CARE_PROVIDER_SITE_OTHER): Payer: Medicare Other | Admitting: Sports Medicine

## 2017-07-25 ENCOUNTER — Ambulatory Visit: Payer: Self-pay

## 2017-07-25 VITALS — BP 128/80 | HR 70 | Ht 67.0 in | Wt 175.2 lb

## 2017-07-25 DIAGNOSIS — M160 Bilateral primary osteoarthritis of hip: Secondary | ICD-10-CM

## 2017-07-25 DIAGNOSIS — I2510 Atherosclerotic heart disease of native coronary artery without angina pectoris: Secondary | ICD-10-CM

## 2017-07-25 DIAGNOSIS — M25552 Pain in left hip: Secondary | ICD-10-CM | POA: Diagnosis not present

## 2017-07-25 NOTE — Progress Notes (Signed)
Benjamin Browning. [place], [place] at [place]  Benjamin Browning - 72 y.o. male MRN [id number]  Date of birth: Mar 19, 1945  Visit Date: 07/25/2017  PCP: Marin Olp, MD   Referred by: Marin Olp, MD  Scribe for today's visit: Wendy Poet, LAT, ATC     SUBJECTIVE:  Benjamin Browning is here for Follow-up (L hip injection) .   02/09/17: His bilateral hip pain, RT knee pain, LT leg pain sx symptoms INITIALLY: Began about 1 year ago in the LT leg and for several years for the RT knee.   Described as severe aching with occasional throbbing, LT leg pain is located just below the patella.  Worsened with bearing weight, going up stairs, sitting to standing.  Improved with rest Additional associated symptoms include: He denies swelling around the LT knee and in the LT leg. He ambulated with a cane. Over the past year he has noticed pain in his RT knee. RT knee pain is around the patella. He denies swelling around the RT knee. Pain is triggered by the same activities that cause the LT knee/leg pain but the RT knee pain is less severe.  At this time symptoms are worsening compared to onset, 6 months ago he did not need to walk around with a a cane.  He has been taking Aspirin 325 twice daily with some relief. He has not tried using compression, ice or heat on either.  Xray bilateral knees 01/28/16 Xray bilateral hips 06/13/16 Xray LT knee 06/02/16  05/04/17: Compared to the last office visit, his previously described symptoms show no change, still having L hip pain. R hip pain has improved. He feels the pain more in the L knee than the L hip. He denies pain in the gluteal region or groin.  Current symptoms are moderate & are radiates to the L knee. Occasionally he will have pain in the L leg and foot. He denies swelling. He is ambulating with a cane today.  He has been taking Aspirin 235 BID for pain with some relief.  He received steroid  injection into both hips at last OV with good relief.   07/25/2017: Compared to the last office visit on 05/04/17, his previously described L hip pain symptoms are worsening w/ increased pain in the L hip particularly w/ walking. Current symptoms are moderate & are radiating to L knee. He has been taking Aspirin 325 mg bid.  He's had B knee XR on 01/28/16, B hip XR on 06/13/16 and a L knee XR on 06/02/16.  ROS Denies night time disturbances. Denies fevers, chills, or night sweats. Denies unexplained weight loss. Reports personal history of cancer - bladder CA Denies changes in bowel or bladder habits. Denies recent unreported falls. Denies new or worsening dyspnea or wheezing. Denies headaches or dizziness.  Reports numbness, tingling or weakness  In the extremities - in B fingers Denies dizziness or presyncopal episodes Denies lower extremity edema    HISTORY & PERTINENT PRIOR DATA:  Prior History reviewed and updated per electronic medical record.  Significant/pertinent history, findings, studies include:  reports that he has been smoking cigars.  He has never used smokeless tobacco. Recent Labs    08/17/16 1056 01/19/17 1014 07/21/17 0851  HGBA1C 7.0* 7.2* 7.0*   No specialty comments available. No problems updated.  OBJECTIVE:  VS:  HT:5\' 7"  (170.2 cm)   WT:175 lb 3.2 oz (79.5 kg)  BMI:27.43  BP:128/80  HR:70bpm  TEMP: ( )  RESP:97 %   PHYSICAL EXAM: Constitutional: WDWN, Non-toxic appearing. Psychiatric: Alert & appropriately interactive.  Not depressed or anxious appearing. Respiratory: No increased work of breathing.  Trachea Midline Eyes: Pupils are equal.  EOM intact without nystagmus.  No scleral icterus  Vascular Exam: warm to touch no edema  lower extremity neuro exam: unremarkable  MSK Exam: Negative straight leg raise bilaterally.  He has pain with Bosie Clos testing.  Pain with FADIR.  Normal on the right with limited range of motion bilateral  hips with internal and external rotation.   ASSESSMENT & PLAN:   1. Left hip pain   2. Bilateral primary osteoarthritis of hip     PLAN: Left hip intra-articular injection performed today.  Follow-up as needed.  Follow-up: Return if symptoms worsen or fail to improve.      Please see additional documentation for Objective, Assessment and Plan sections. Pertinent additional documentation may be included in corresponding procedure notes, imaging studies, problem based documentation and patient instructions. Please see these sections of the encounter for additional information regarding this visit.  CMA/ATC served as Education administrator during this visit. History, Physical, and Plan performed by medical provider. Documentation and orders reviewed and attested to.      Gerda Diss, [place] [facility] Physician

## 2017-07-25 NOTE — Procedures (Signed)
PROCEDURE NOTE:  Ultrasound Guided: Injection: Left hip Images were obtained and interpreted by myself, Teresa Coombs, DO  Images have been saved and stored to PACS system. Images obtained on: GE S7 Ultrasound machine    ULTRASOUND FINDINGS:  degenerative changes   DESCRIPTION OF PROCEDURE:  The patient's clinical condition is marked by substantial pain and/or significant functional disability. Other conservative therapy has not provided relief, is contraindicated, or not appropriate. There is a reasonable likelihood that injection will significantly improve the patient's pain and/or functional impairment.   After discussing the risks, benefits and expected outcomes of the injection and all questions were reviewed and answered, the patient wished to undergo the above named procedure.  Verbal consent was obtained.  The ultrasound was used to identify the target structure and adjacent neurovascular structures. The skin was then prepped in sterile fashion and the target structure was injected under direct visualization using sterile technique as below:  PREP: Alcohol and Ethel Chloride APPROACH: direct, stopcock technique, 22g 3.5 in. INJECTATE: 4 cc 1% lidocaine, 2 cc 0.5% Marcaine and 2 cc 40mg /mL DepoMedrol ASPIRATE: None DRESSING: Band-Aid  Post procedural instructions including recommending icing and warning signs for infection were reviewed.    This procedure was well tolerated and there were no complications.   IMPRESSION: Succesful Ultrasound Guided: Injection

## 2017-07-25 NOTE — Patient Instructions (Signed)

## 2017-08-15 ENCOUNTER — Other Ambulatory Visit: Payer: Self-pay | Admitting: Family Medicine

## 2017-10-12 ENCOUNTER — Encounter: Payer: Self-pay | Admitting: Sports Medicine

## 2017-10-12 ENCOUNTER — Ambulatory Visit: Payer: Self-pay

## 2017-10-12 ENCOUNTER — Ambulatory Visit (INDEPENDENT_AMBULATORY_CARE_PROVIDER_SITE_OTHER): Payer: Medicare Other | Admitting: Sports Medicine

## 2017-10-12 VITALS — BP 120/86 | HR 66 | Ht 67.0 in | Wt 177.6 lb

## 2017-10-12 DIAGNOSIS — I2510 Atherosclerotic heart disease of native coronary artery without angina pectoris: Secondary | ICD-10-CM

## 2017-10-12 DIAGNOSIS — M160 Bilateral primary osteoarthritis of hip: Secondary | ICD-10-CM | POA: Diagnosis not present

## 2017-10-12 DIAGNOSIS — M25552 Pain in left hip: Secondary | ICD-10-CM | POA: Diagnosis not present

## 2017-10-12 NOTE — Procedures (Signed)
PROCEDURE NOTE:  Ultrasound Guided: Injection: Left hip Images were obtained and interpreted by myself, Teresa Coombs, DO  Images have been saved and stored to PACS system. Images obtained on: GE S7 Ultrasound machine    ULTRASOUND FINDINGS:  Marked degenerative changes.  Small effusion.  DESCRIPTION OF PROCEDURE:  The patient's clinical condition is marked by substantial pain and/or significant functional disability. Other conservative therapy has not provided relief, is contraindicated, or not appropriate. There is a reasonable likelihood that injection will significantly improve the patient's pain and/or functional impairment.   After discussing the risks, benefits and expected outcomes of the injection and all questions were reviewed and answered, the patient wished to undergo the above named procedure.  Verbal consent was obtained.  The ultrasound was used to identify the target structure and adjacent neurovascular structures. The skin was then prepped in sterile fashion and the target structure was injected under direct visualization using sterile technique as below:  Single injection performed as below: PREP: Alcohol and Ethel Chloride APPROACH:direct, stopcock technique, 22g 3.5 in. INJECTATE: 3 cc 1% lidocaine, 2 cc 0.5% Marcaine, 1 cc 40mg /mL DepoMedrol and 1 cc 40 mg/mL Kenalog ASPIRATE: None DRESSING: Band-Aid  Post procedural instructions including recommending icing and warning signs for infection were reviewed.    This procedure was well tolerated and there were no complications.   IMPRESSION: Succesful Ultrasound Guided: Injection

## 2017-10-12 NOTE — Patient Instructions (Signed)

## 2017-10-12 NOTE — Progress Notes (Signed)
[name] Eleazar [place], [place] at [place]  THEODORE VIRGIN - 72 y.o. male MRN [id number]  Date of birth: Apr 25, 1945  Visit Date: 10/12/2017  PCP: Marin Olp, MD   Referred by: Marin Olp, MD  Scribe(s) for today's visit: Josepha Pigg, CMA  SUBJECTIVE:  Chelsea Primus is here for Follow-up (L hip pain)   02/09/17: His bilateral hip pain, RT knee pain, LT leg pain sx symptoms INITIALLY: Began about 1 year ago in the LT leg and for several years for the RT knee.   Described as severe aching with occasional throbbing, LT leg pain is located just below the patella.  Worsened with bearing weight, going up stairs, sitting to standing.  Improved with rest Additional associated symptoms include: He denies swelling around the LT knee and in the LT leg. He ambulated with a cane. Over the past year he has noticed pain in his RT knee. RT knee pain is around the patella. He denies swelling around the RT knee. Pain is triggered by the same activities that cause the LT knee/leg pain but the RT knee pain is less severe.  At this time symptoms are worsening compared to onset, 6 months ago he did not need to walk around with a a cane.  He has been taking Aspirin 325 twice daily with some relief. He has not tried using compression, ice or heat on either.  Xray bilateral knees 01/28/16 Xray bilateral hips 06/13/16 Xray LT knee 06/02/16  05/04/17: Compared to the last office visit, his previously described symptoms show no change, still having L hip pain. R hip pain has improved. He feels the pain more in the L knee than the L hip. He denies pain in the gluteal region or groin.  Current symptoms are moderate & are radiates to the L knee. Occasionally he will have pain in the L leg and foot. He denies swelling. He is ambulating with a cane today.  He has been taking Aspirin 235 BID for pain with some relief.  He received steroid injection  into both hips at last OV with good relief.   07/25/2017: Compared to the last office visit on 05/04/17, his previously described L hip pain symptoms are worsening w/ increased pain in the L hip particularly w/ walking. Current symptoms are moderate & are radiating to L knee. He has been taking Aspirin 325 mg bid.  He's had B knee XR on 01/28/16, B hip XR on 06/13/16 and a L knee XR on 06/02/16.  10/12/2017: 10/12/2017: Compared to the last office visit on 07/25/17, his previously described L hip pain symptoms are worsening, started to flare up again a couple of weeks ago. Pain is 8/10. He denies swelling, clicking, popping, catching. He c/o LBP.  Current symptoms are moderate & are non-radiating, denies radiation of pain into the legs, gluteal region or groin.  He has been taking Aspirin 325 mg bid with temporary relief.  He had a 2/2 Depomedrol 40 injection at his last visit and a prior injection on 02/09/17.  B knee XR - 01/28/16; L knee XR - 06/02/16 and B hip XR - 06/13/16   REVIEW OF SYSTEMS: Denies night time disturbances. Denies fevers, chills, or night sweats. Denies unexplained weight loss. Reports personal history of bladder cancer. Denies changes in bowel or bladder habits. Reports recent unreported falls - 1 fall, lost balance and landed on L side. . Denies new or worsening dyspnea  or wheezing. Denies headaches or dizziness.  Reports numbness, tingling in B fingers  Denies dizziness or presyncopal episodes Denies lower extremity edema     HISTORY & PERTINENT PRIOR DATA:  Significant/pertinent history, findings, studies include:  reports that he has been smoking cigars. He has never used smokeless tobacco. Recent Labs    01/19/17 1014 07/21/17 0851  HGBA1C 7.2* 7.0*   No specialty comments available. Problem  Bilateral Primary Osteoarthritis of Hip   He is not interested in total hip arthroplasty however he is an appropriate candidate if he changes his mind. 06/29/2016 -  Bilateral Injection with 40mg  Depo @ [facility] 02/09/17 - Bilateral injections with 40mg  Depo & 40mg  Kenalog on Left - 40mg  Depo Right with [name]      Otherwise prior history reviewed and updated per electronic medical record.    OBJECTIVE:  VS:  HT:5\' 7"  (170.2 cm)   WT:177 lb 9.6 oz (80.6 kg)  BMI:27.81    BP:120/86  HR:66bpm  TEMP: ( )  RESP:95 %   PHYSICAL EXAM: CONSTITUTIONAL: Well-developed, Well-nourished and In no acute distress Alert & appropriately interactive. and Not depressed or anxious appearing. RESPIRATORY: No increased work of breathing and Trachea Midline EYES: Pupils are equal., EOM intact without nystagmus. and No scleral icterus.  Lower extremities: Warm and well perfused Edema: No significant swelling or edema NEURO: unremarkable  MSK Exam: Left hip: . Well aligned, no significant deformity. . No overlying skin changes. . TTP over Tender over the greater trochanter mildly.  Otherwise no focal bony tenderness. .  Donah DriverBosie Clos testing, failure testing and Faber testing.  Markedly limited internal and external range of motion.   PROCEDURES & DATA REVIEWED:  US Guided Injection per procedure note  ASSESSMENT   1. Left hip pain   2. Bilateral primary osteoarthritis of hip     PLAN:       . Discussion today about the option for total hip arthroplasty.  Ultimately he is getting good relief and we can repeat these injections every 10 to 12 weeks as needed.  He would like to avoid surgery especially given his cardiac history. . Please see procedure section and notes. No problem-specific Assessment & Plan notes found for this encounter.  Follow-up: Return in about 10 weeks (around 12/21/2017).      Please see additional documentation for Objective, Assessment and Plan sections. Pertinent additional documentation may be included in corresponding procedure notes, imaging studies, problem based documentation and patient instructions.  Please see these sections of the encounter for additional information regarding this visit.  CMA/ATC served as Education administrator during this visit. History, Physical, and Plan performed by medical provider. Documentation and orders reviewed and attested to.      Gerda Diss, [place] [facility] Physician

## 2017-10-17 LAB — HM DIABETES EYE EXAM

## 2017-11-02 ENCOUNTER — Encounter: Payer: Self-pay | Admitting: Family Medicine

## 2017-12-21 ENCOUNTER — Encounter: Payer: Self-pay | Admitting: Sports Medicine

## 2017-12-21 ENCOUNTER — Ambulatory Visit (INDEPENDENT_AMBULATORY_CARE_PROVIDER_SITE_OTHER): Payer: Medicare Other | Admitting: Sports Medicine

## 2017-12-21 ENCOUNTER — Ambulatory Visit: Payer: Self-pay

## 2017-12-21 VITALS — BP 122/80 | HR 72 | Ht 67.0 in | Wt 181.8 lb

## 2017-12-21 DIAGNOSIS — M160 Bilateral primary osteoarthritis of hip: Secondary | ICD-10-CM | POA: Diagnosis not present

## 2017-12-21 DIAGNOSIS — Z23 Encounter for immunization: Secondary | ICD-10-CM

## 2017-12-21 DIAGNOSIS — M25552 Pain in left hip: Secondary | ICD-10-CM

## 2017-12-21 DIAGNOSIS — I2510 Atherosclerotic heart disease of native coronary artery without angina pectoris: Secondary | ICD-10-CM | POA: Diagnosis not present

## 2017-12-21 NOTE — Progress Notes (Signed)
Benjamin Browning. Benjamin Browning, [name] [place]  BROADY LAFOY - 72 y.o. male MRN [id number]  Date of birth: 1945/11/23  Visit Date: 12/21/2017  PCP: Benjamin Browning   Referred by: Benjamin Browning  Scribe(s) for today's visit: Benjamin Browning  SUBJECTIVE:  Benjamin Browning is here for Follow-up (L hip pain)   HPI  02/09/17: His bilateral hip pain, RT knee pain, LT leg pain sx symptoms INITIALLY: Began about 1 year ago in the LT leg and for several years for the RT knee.   Described as severe aching with occasional throbbing, LT leg pain is located just below the patella.  Worsened with bearing weight, going up stairs, sitting to standing.  Improved with rest Additional associated symptoms include: He denies swelling around the LT knee and in the LT leg. He ambulated with a cane. Over the past year he has noticed pain in his RT knee. RT knee pain is around the patella. He denies swelling around the RT knee. Pain is triggered by the same activities that cause the LT knee/leg pain but the RT knee pain is less severe.  At this time symptoms are worsening compared to onset, 6 months ago he did not need to walk around with a a cane.  He has been taking Aspirin 325 twice daily with some relief. He has not tried using compression, ice or heat on either.   05/04/17: Compared to the last office visit, his previously described symptoms show no change, still having L hip pain. R hip pain has improved. He feels the pain more in the L knee than the L hip. He denies pain in the gluteal region or groin.  Current symptoms are moderate & are radiates to the L knee. Occasionally he will have pain in the L leg and foot. He denies swelling. He is ambulating with a cane today.  He has been taking Aspirin 235 BID for pain with some relief.  He received steroid injection into both hips at last OV with good relief.   07/25/2017: Compared to  the last office visit on 05/04/17, his previously described L hip pain symptoms are worsening w/ increased pain in the L hip particularly w/ walking. Current symptoms are moderate & are radiating to L knee. He has been taking Aspirin 325 mg bid.  He's had B knee XR on 01/28/16, B hip XR on 06/13/16 and a L knee XR on 06/02/16.  10/12/2017: Compared to the last office visit on 07/25/17, his previously described L hip pain symptoms are worsening, started to flare up again a couple of weeks ago. Pain is 8/10. He denies swelling, clicking, popping, catching. He c/o LBP.  Current symptoms are moderate & are non-radiating, denies radiation of pain into the legs, gluteal region or groin.  He has been taking Aspirin 325 mg bid with temporary relief.  He had a 2/2 Depomedrol 40 injection at his last visit and a prior injection on 02/09/17. B knee XR - 01/28/16; L knee XR - 06/02/16 and B hip XR - 06/13/16  12/21/2017: Compared to the last office visit, his previously described symptoms are worsening, sx have started to flare-up over the past 2 weeks. Pain is more constant. Pain is mostly lateral. She denies popping, clicking, catching, radiating pain. He denies weakness in the L leg, he reports that he doesn't put a lot of weight on the L leg d/t the pain though.  He has chronic LBP.  Current symptoms are moderate & are nonradiating He has been taking Aspirin 325 with some relief. He is ambulating with a cane.  His last injection was 10/12/2017.    REVIEW OF SYSTEMS: Reports night time disturbances. Denies fevers, chills, or night sweats. Denies unexplained weight loss. Reports personal history of bladder cancer. Denies changes in bowel or bladder habits. Reports recent unreported falls x 2, he recalls landing on his L side but no injuries.  Denies new or worsening dyspnea or wheezing. Denies headaches or dizziness.  Reports numbness, tingling in B fingers  Denies dizziness or presyncopal episodes Denies  lower extremity edema    HISTORY:  Prior history reviewed and updated per electronic medical record.  Social History   Occupational History  . Not on file  Tobacco Use  . Smoking status: Current Every Day Smoker    Types: Cigars  . Smokeless tobacco: Never Used  . Tobacco comment: 2 cigars per day/w down to one a day   Substance and Sexual Activity  . Alcohol use: Yes    Alcohol/week: 3.0 standard drinks    Types: 3 Standard drinks or equivalent per week  . Drug use: No  . Sexual activity: Not on file   Social History   Social History Narrative   Separated. 3 kids. 10 grandkids.       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       Hobbies: reading, used to play a lot of golf    DATA OBTAINED & REVIEWED:   Recent Labs    01/19/17 1014 07/21/17 0851  HGBA1C 7.2* 7.0*   Problem  Bilateral Primary Osteoarthritis of Hip   He is not interested in total hip arthroplasty however he is an appropriate candidate if he changes his mind. 06/29/2016 - Bilateral Injection with 40mg  Depo @ [facility] 02/09/17 - Bilateral injections with 40mg  Depo & 40mg  Kenalog on Left - 40mg  Depo Right with Benjamin Browning      Xray bilateral knees 01/28/16  Xray bilateral hips 06/13/16  Xray LT knee 06/02/16 .   OBJECTIVE:  VS:  HT:5\' 7"  (170.2 cm)   WT:181 lb 12.8 oz (82.5 kg)  BMI:28.47    BP:122/80  HR:72bpm  TEMP: ( )  RESP:97 %   PHYSICAL EXAM: CONSTITUTIONAL: Well-developed, Well-nourished and In no acute distress PSYCHIATRIC: Alert & appropriately interactive. and Not depressed or anxious appearing. RESPIRATORY: No increased work of breathing and Trachea Midline EYES: Pupils are equal., EOM intact without nystagmus. and No scleral icterus.  VASCULAR EXAM: Warm and well perfused NEURO: unremarkable  MSK Exam: Left hip  Well aligned, no significant deformity. No overlying skin changes. No focal bony tenderness   RANGE OF MOTION & STRENGTH  Limited and painful: IR and ER of the  HIP   SPECIALITY TESTING:  + stinchfield  minimal edema     ASSESSMENT   1. Need for influenza vaccination   2. Bilateral primary osteoarthritis of hip   3. Coronary artery disease involving native coronary artery of native heart without angina pectoris   4. Left hip pain     PLAN:  Pertinent additional documentation may be included in corresponding procedure notes, imaging studies, problem based documentation and patient instructions.  Procedures:  . US Guided Injection per procedure note  Medications:  No orders of the defined types were placed in this encounter.  Discussion/Instructions: Bilateral primary osteoarthritis of hip He has been doing well with injections but is having some diminishing return  with this.  He is potentially interested in total hip arthroplasty and will place referral to Dr. Ninfa Linden for surgical consultation.  He reportedly has an upcoming cardiology appointment in December and I would like for him to be scheduled prior to this so that Dr. Ninfa Linden can request appropriate cardiac clearance.  We will go ahead and inject his left hip again today.  He does understand that he needs at least 6 weeks in between injection and a total hip arthroplasty.  >50% of this 25 minute visit spent in direct patient counseling and/or coordination of care.  Discussion was focused on education regarding the in discussing the pathoetiology and anticipated clinical course of the above condition.  . Repeat injection today . He will call to ensure follow up with Cardiology is scheduled . Discussed red flag symptoms that warrant earlier emergent evaluation and patient voices understanding. . Activity modifications and the importance of avoiding exacerbating activities (limiting pain to no more than a 4 / 10 during or following activity) recommended and discussed.  Follow-up:  . Return if symptoms worsen or fail to improve.  . At follow up will plan to consider: repeat  corticosteroid injections     Browning/ATC served as scribe during this visit. History, Physical, and Plan performed by medical provider. Documentation and orders reviewed and attested to.      Gerda Diss, [facility] Physician

## 2017-12-21 NOTE — Procedures (Signed)
PROCEDURE NOTE:  Ultrasound Guided: Injection: Left hip Images were obtained and interpreted by myself, Teresa Coombs, DO  Images have been saved and stored to PACS system. Images obtained on: GE S7 Ultrasound machine    ULTRASOUND FINDINGS:  Moderate degenerative change  DESCRIPTION OF PROCEDURE:  The patient's clinical condition is marked by substantial pain and/or significant functional disability. Other conservative therapy has not provided relief, is contraindicated, or not appropriate. There is a reasonable likelihood that injection will significantly improve the patient's pain and/or functional impairment.   After discussing the risks, benefits and expected outcomes of the injection and all questions were reviewed and answered, the patient wished to undergo the above named procedure.  Verbal consent was obtained.  The ultrasound was used to identify the target structure and adjacent neurovascular structures. The skin was then prepped in sterile fashion and the target structure was injected under direct visualization using sterile technique as below:  Single injection performed as below: PREP: Alcohol and Ethel Chloride APPROACH:direct, stopcock technique, 22g 3.5 in. INJECTATE: 5 cc 1% lidocaine, 2 cc 0.5% Marcaine, 1 cc 40mg /mL DepoMedrol and 1 cc 40 mg/mL Kenalog ASPIRATE: None DRESSING: Band-Aid  Post procedural instructions including recommending icing and warning signs for infection were reviewed.    This procedure was well tolerated and there were no complications.   IMPRESSION: Succesful Ultrasound Guided: Injection

## 2017-12-21 NOTE — Patient Instructions (Addendum)

## 2017-12-21 NOTE — Assessment & Plan Note (Signed)
He has been doing well with injections but is having some diminishing return with this.  He is potentially interested in total hip arthroplasty and will place referral to Dr. Ninfa Linden for surgical consultation.  He reportedly has an upcoming cardiology appointment in December and I would like for him to be scheduled prior to this so that Dr. Ninfa Linden can request appropriate cardiac clearance.  We will go ahead and inject his left hip again today.  He does understand that he needs at least 6 weeks in between injection and a total hip arthroplasty.  >50% of this 25 minute visit spent in direct patient counseling and/or coordination of care.  Discussion was focused on education regarding the in discussing the pathoetiology and anticipated clinical course of the above condition.

## 2017-12-23 ENCOUNTER — Other Ambulatory Visit: Payer: Self-pay | Admitting: Family Medicine

## 2017-12-26 ENCOUNTER — Encounter (INDEPENDENT_AMBULATORY_CARE_PROVIDER_SITE_OTHER): Payer: Self-pay | Admitting: Orthopaedic Surgery

## 2017-12-26 ENCOUNTER — Ambulatory Visit (INDEPENDENT_AMBULATORY_CARE_PROVIDER_SITE_OTHER): Payer: Medicare Other

## 2017-12-26 ENCOUNTER — Ambulatory Visit (INDEPENDENT_AMBULATORY_CARE_PROVIDER_SITE_OTHER): Payer: Medicare Other | Admitting: Orthopaedic Surgery

## 2017-12-26 DIAGNOSIS — M25552 Pain in left hip: Secondary | ICD-10-CM

## 2017-12-26 DIAGNOSIS — I2510 Atherosclerotic heart disease of native coronary artery without angina pectoris: Secondary | ICD-10-CM | POA: Diagnosis not present

## 2017-12-26 NOTE — Progress Notes (Signed)
Office Visit Note   Patient: Benjamin Browning           Date of Birth: Nov 10, 1945           MRN: [id number] Visit Date: 12/26/2017              Requested by: Marin Olp, MD [place], [place] [address] PCP: Marin Olp, MD   Assessment & Plan: Visit Diagnoses:  1. Pain in left hip     Plan: Patient is failed conservative treatment which is consisted of intra-articular injections left hip that are now short-lived, assistive device being a cane and time.  He continues to have severe left hip pain it is affecting his quality of life.  Recommend left total hip arthroplasty he agrees it would like to proceed with this near future.  He is to see his cardiologist in mid December and also see the dentist .  Once he has been cleared from a dental aspect and from cardiology aspect set him up for a left total hip arthroplasty.  He is given Samella Parr card to schedule the left total hip arthroplasty.  Questions encouraged and answered handout on hip replacement was given.  Hip model was shown.  Risk including infection, DVT/PE, wound healing problems, prolonged pain worsening pain all discussed with patient at length.  Questions were encouraged and answered by Dr. Ninfa Linden and myself.  Follow-Up Instructions: No follow-ups on file.   Orders:  Orders Placed This Encounter  Procedures  . XR HIP UNILAT W OR W/O PELVIS 1V LEFT   No orders of the defined types were placed in this encounter.     Procedures: No procedures performed   Clinical Data: No additional findings.   Subjective: Chief Complaint  Patient presents with  . Left Hip - Pain    HPI Benjamin Browning is a 73 year old male comes in today with left hip pain with ambulation.  He ambulates with a cane.  He states his been told that he has osteoarthritis he is been seeing Dr. [name] has had a total of 3 intra-articular injections in the left hip.  States the injections are not lasting as long as they have  in the past.  States he has minimal pain in the right hip.  Pain in the left hip is been ongoing for over a year.  He states that the injections in the left hip are not lasting as long.  He has been taking aspirin 325 mg twice daily to get some relief.  He is using a cane to ambulate.  He is diabetic with last hemoglobin A1c of 7.0.  He states he has no tooth pain but does have several teeth that "are not in the best shape".  He has not seen a dentist for some time now.  Review of Systems Denies any dizziness nausea/vomiting fevers chills shortness of breath orthopnea.  He does state he had some chest pain 4 weeks ago and took one nitro and has had no chest pain since.  Otherwise please see HPI.  Objective: Vital Signs: There were no vitals taken for this visit.  Physical Exam  Constitutional: He is oriented to person, place, and time. He appears well-developed and well-nourished. No distress.  Pulmonary/Chest: Effort normal.  Neurological: He is alert and oriented to person, place, and time.  Skin: He is not diaphoretic.  Psychiatric: He has a normal mood and affect.    Ortho Exam Bilateral hips he has limited internal  rotation.  He has pain with internal rotation of the left hip.  Calf supple nontender bilaterally.  Dorsal pedal pulses are 2+ bilaterally.  Leg lengths are equal. Specialty Comments:  No specialty comments available.  Imaging: Xr Hip Unilat W Or W/o Pelvis 1v Left  Result Date: 12/26/2017 AP pelvis lateral view of the left hip: Bilateral hips well located.  No acute fractures.  Moderately severe bilateral hip arthritis.    PMFS History: Patient Active Problem List   Diagnosis Date Noted  . Bilateral primary osteoarthritis of hip 06/29/2016  . Hyperlipidemia 12/31/2013  . DNR (do not resuscitate) 12/31/2013  . Bilateral knee pain 07/26/2012  . TOBACCO USE 03/17/2008  . Essential hypertension 01/22/2007  . History of bladder cancer 08/21/2006  . Diabetes mellitus  type II, controlled ([place]) 08/21/2006  . CAD (coronary artery disease) 08/21/2006  . History of CVA (cerebrovascular accident) 08/21/2006   Past Medical History:  Diagnosis Date  . CAD (coronary artery disease)   . Cerebrovascular accident ([place]) 4/01  . Diabetes mellitus 2005   type 2  . Heart attack ([place]) 2005   w/ Taxus stenting to a native vessel   . Heart attack ([place]) 1991  . Hyperlipidemia   . Positive PPD     Family History  Problem Relation Age of Onset  . Hodgkin's lymphoma Father        died in 50s    Past Surgical History:  Procedure Laterality Date  . APPENDECTOMY     as child  . CORONARY ARTERY BYPASS GRAFT  1991  . [place]   Social History   Occupational History  . Not on file  Tobacco Use  . Smoking status: Current Every Day Smoker    Types: Cigars  . Smokeless tobacco: Never Used  . Tobacco comment: 2 cigars per day/w down to one a day   Substance and Sexual Activity  . Alcohol use: Yes    Alcohol/week: 3.0 standard drinks    Types: 3 Standard drinks or equivalent per week  . Drug use: No  . Sexual activity: Not on file

## 2018-01-10 ENCOUNTER — Other Ambulatory Visit: Payer: Self-pay | Admitting: Family Medicine

## 2018-01-23 ENCOUNTER — Other Ambulatory Visit: Payer: Self-pay | Admitting: Family Medicine

## 2018-01-23 ENCOUNTER — Encounter: Payer: Self-pay | Admitting: Family Medicine

## 2018-01-23 ENCOUNTER — Ambulatory Visit (INDEPENDENT_AMBULATORY_CARE_PROVIDER_SITE_OTHER): Payer: Medicare Other | Admitting: Family Medicine

## 2018-01-23 VITALS — BP 136/72 | HR 71 | Temp 97.8°F | Ht 67.0 in | Wt 180.0 lb

## 2018-01-23 DIAGNOSIS — E785 Hyperlipidemia, unspecified: Secondary | ICD-10-CM | POA: Diagnosis not present

## 2018-01-23 DIAGNOSIS — E119 Type 2 diabetes mellitus without complications: Secondary | ICD-10-CM | POA: Diagnosis not present

## 2018-01-23 DIAGNOSIS — I10 Essential (primary) hypertension: Secondary | ICD-10-CM

## 2018-01-23 DIAGNOSIS — I2510 Atherosclerotic heart disease of native coronary artery without angina pectoris: Secondary | ICD-10-CM | POA: Diagnosis not present

## 2018-01-23 DIAGNOSIS — Z6828 Body mass index (BMI) 28.0-28.9, adult: Secondary | ICD-10-CM | POA: Diagnosis not present

## 2018-01-23 LAB — CBC
HEMATOCRIT: 43.6 % (ref 39.0–52.0)
Hemoglobin: 14.5 g/dL (ref 13.0–17.0)
MCHC: 33.2 g/dL (ref 30.0–36.0)
MCV: 88.9 fl (ref 78.0–100.0)
PLATELETS: 327 10*3/uL (ref 150.0–400.0)
RBC: 4.9 Mil/uL (ref 4.22–5.81)
RDW: 13.6 % (ref 11.5–15.5)
WBC: 10.9 10*3/uL — ABNORMAL HIGH (ref 4.0–10.5)

## 2018-01-23 LAB — COMPREHENSIVE METABOLIC PANEL
ALBUMIN: 4.8 g/dL (ref 3.5–5.2)
ALT: 14 U/L (ref 0–53)
AST: 15 U/L (ref 0–37)
Alkaline Phosphatase: 78 U/L (ref 39–117)
BUN: 16 mg/dL (ref 6–23)
CALCIUM: 9.8 mg/dL (ref 8.4–10.5)
CHLORIDE: 103 meq/L (ref 96–112)
CO2: 20 meq/L (ref 19–32)
CREATININE: 1.09 mg/dL (ref 0.40–1.50)
GFR: 70.38 mL/min (ref >60.00)
Glucose, Bld: 126 mg/dL — ABNORMAL HIGH (ref 70–99)
POTASSIUM: 4.5 meq/L (ref 3.5–5.1)
SODIUM: 138 meq/L (ref 135–145)
Total Bilirubin: 0.5 mg/dL (ref 0.2–1.2)
Total Protein: 7.8 g/dL (ref 6.0–8.3)

## 2018-01-23 LAB — POCT GLYCOSYLATED HEMOGLOBIN (HGB A1C): Hemoglobin A1C: 7 % — AB (ref 4.0–5.6)

## 2018-01-23 LAB — LIPID PANEL
CHOL/HDL RATIO: 3
CHOLESTEROL: 129 mg/dL (ref 0–200)
HDL: 39.2 mg/dL (ref >39.00)
LDL CALC: 59 mg/dL (ref 0–99)
NonHDL: 89.53
TRIGLYCERIDES: 154 mg/dL — AB (ref 0.0–149.0)
VLDL: 30.8 mg/dL (ref 0.0–40.0)

## 2018-01-23 NOTE — Progress Notes (Signed)
Subjective:  Benjamin Browning is a 72 y.o. year old very pleasant male patient who presents for/with See problem oriented charting ROS- no hyperglycemia. No edema. No reported chest pain. No reported headaches.    Past Medical History-  Patient Active Problem List   Diagnosis Date Noted  . DNR (do not resuscitate) 12/31/2013    Priority: High  . TOBACCO USE 03/17/2008    Priority: High  . Diabetes mellitus type II, controlled ([place]) 08/21/2006    Priority: High  . CAD (coronary artery disease) 08/21/2006    Priority: High  . Hyperlipidemia 12/31/2013    Priority: Medium  . Essential hypertension 01/22/2007    Priority: Medium  . History of bladder cancer 08/21/2006    Priority: Medium  . History of CVA (cerebrovascular accident) 08/21/2006    Priority: Medium  . Bilateral primary osteoarthritis of hip 06/29/2016  . Bilateral knee pain 07/26/2012    Medications- reviewed and updated Current Outpatient Medications  Medication Sig Dispense Refill  . amLODipine (NORVASC) 10 MG tablet TAKE 1 TABLET DAILY 90 tablet 3  . aspirin 325 MG EC tablet Take 650 mg by mouth daily.     Marland Kitchen atorvastatin (LIPITOR) 20 MG tablet TAKE 1 TABLET DAILY 90 tablet 3  . enalapril (VASOTEC) 20 MG tablet TAKE 1 TABLET TWICE A DAY 60 tablet 0  . glimepiride (AMARYL) 2 MG tablet TAKE 1 TABLET EVERY MORNING 30 tablet 0  . metFORMIN (GLUCOPHAGE) 1000 MG tablet TAKE 1 TABLET TWICE A DAY 180 tablet 3  . metoprolol tartrate (LOPRESSOR) 50 MG tablet TAKE 1 TABLET TWICE A DAY 180 tablet 3  . glimepiride (AMARYL) 2 MG tablet TAKE 1 TABLET EVERY        MORNING. MAKE AN           APPOINTMENT FOR FURTHER    REFILLS . 90 tablet 3  . nitroGLYCERIN (NITROSTAT) 0.4 MG SL tablet Place 1 tablet (0.4 mg total) under the tongue every 5 (five) minutes as needed for chest pain. 25 tablet 11   No current facility-administered medications for this visit.     Objective: BP 136/72 (BP Location: Left Arm, Patient Position: Sitting,  Cuff Size: Large)   Pulse 71   Temp 97.8 F (36.6 C) (Oral)   Ht 5\' 7"  (1.702 m)   Wt 180 lb (81.6 kg)   SpO2 96%   BMI 28.19 kg/m  Gen: NAD, resting comfortably CV: RRR no murmurs rubs or gallops Lungs: CTAB no crackles, wheeze, rhonchi Abdomen: soft/nontender/nondistended/normal bowel sounds. obese Ext: no edema Skin: warm, dry Neuro: speech normal, moves all extremities  Diabetic Foot Exam - Simple   Simple Foot Form Diabetic Foot exam was performed with the following findings:  Yes 01/23/2018  8:25 AM  Visual Inspection No deformities, no ulcerations, no other skin breakdown bilaterally:  Yes Sensation Testing Intact to touch and monofilament testing bilaterally:  Yes Pulse Check Posterior Tibialis and Dorsalis pulse intact bilaterally:  Yes Comments    Assessment/Plan:  Other notes: 1.still one cigar a day- declines stopping this. I encouraged him to stop smoking at least a month before surgery and a month after surgery if at all possible.  2. Upcoming left hip surgery- has talked to surgeon Dr. Oletha Cruel has to see cardiology again before surgery.  Outside of quitting smoking- I do not have another medical reason to postpone his surgery 3. Recommended Tdap or Td at pharmacy 4.  Elevated BMI- further weight loss would be beneficial for diabetes,  lipids, blood pressure  Diabetes mellitus type II, controlled ([place]) S: well controlled on metformin 1g BID, amaryl 2mg  daily Lab Results  Component Value Date   HGBA1C 7.0 (A) 01/23/2018   HGBA1C 7.0 (H) 07/21/2017   HGBA1C 7.2 (H) 01/19/2017   A/P: improved control- continue current rx. The amaryl is cost effective for patient but otherwise not my first choices  Hyperlipidemia S:  controlled on atorvastatin 20mg  with LDL 63 in may.   A/P: stable, continue current rx  Essential hypertension S: controlled on enalapril 20mg , metoprolol 50mg  BID, amlodipine 10mg  BP Readings from Last 3 Encounters:  01/23/18 136/72   12/21/17 122/80  10/12/17 120/86  A/P: We discussed blood pressure goal of <140/90. Continue current meds   Future Appointments  Date Time Provider [place]  02/02/2018  9:00 AM Richardo Priest, MD [facility] None  07/26/2018  9:20 AM Yong Channel Brayton Mars, MD [facility]   86-month follow-up scheduled with me  Lab/Order associations: Controlled type 2 diabetes mellitus without complication, without long-term current use of insulin ([place]) - Plan: POCT glycosylated hemoglobin (Hb A1C)  Hyperlipidemia, unspecified hyperlipidemia type - Plan: CBC, Comprehensive metabolic panel, Lipid panel  Essential hypertension  BMI 28.0-28.9,adult  Return precautions advised.  Garret Reddish, MD

## 2018-01-23 NOTE — Assessment & Plan Note (Signed)
S:  controlled on atorvastatin 20mg  with LDL 63 in may.   A/P: stable, continue current rx

## 2018-01-23 NOTE — Assessment & Plan Note (Signed)
S: well controlled on metformin 1g BID, amaryl 2mg  daily Lab Results  Component Value Date   HGBA1C 7.0 (A) 01/23/2018   HGBA1C 7.0 (H) 07/21/2017   HGBA1C 7.2 (H) 01/19/2017   A/P: improved control- continue current rx. The amaryl is cost effective for patient but otherwise not my first choices

## 2018-01-23 NOTE — Assessment & Plan Note (Signed)
S: controlled on enalapril 20mg , metoprolol 50mg  BID, amlodipine 10mg  BP Readings from Last 3 Encounters:  01/23/18 136/72  12/21/17 122/80  10/12/17 120/86  A/P: We discussed blood pressure goal of <140/90. Continue current meds

## 2018-01-23 NOTE — Patient Instructions (Addendum)
Health Maintenance Due  Topic Date Due  . TETANUS/TDAP -please let us know when you get this from the pharmacy 12/23/2015  . FOOT EXAM -completed today 08/17/2017  . HEMOGLOBIN A1C -completed today- looks great 01/20/2018   Lab Results  Component Value Date   HGBA1C 7.0 (A) 01/23/2018   Please stop by lab before you go

## 2018-01-24 ENCOUNTER — Encounter: Payer: Self-pay | Admitting: Family Medicine

## 2018-01-30 ENCOUNTER — Other Ambulatory Visit: Payer: Self-pay | Admitting: Family Medicine

## 2018-02-01 NOTE — Progress Notes (Signed)
Cardiology Office Note:    Date:  02/02/2018   ID:  Benjamin Browning, DOB 05-24-1945, MRN [id number]  PCP:  Benjamin Olp, MD  Cardiologist:  Benjamin More, MD    Referring MD: Benjamin Olp, MD    ASSESSMENT:    1. Coronary artery disease involving native coronary artery of native heart with angina pectoris ([place])   2. Essential hypertension   3. Hyperlipidemia, unspecified hyperlipidemia type   4. Controlled type 2 diabetes mellitus without complication, without long-term current use of insulin (HCC)    PLAN:    In order of problems listed above:  1. Stable, New York Heart Association class I, having no anginal discomfort continue medical treatment advised him to transition to low-dose aspirin he has taken 5 grains his entire life and told me that he will continue the same.  At this time I would not advise an ischemia evaluation 2. Stable BP at target continue beta-blocker 3. Stable his lipids are ideal continue statin 4. Well-controlled A1c is 7 with additional agent as needed GLP-1 or SGLT2 agents would be appropriate   Next appointment: 12 mos   Medication Adjustments/Labs and Tests Ordered: Current medicines are reviewed at length with the patient today.  Concerns regarding medicines are outlined above.  Orders Placed This Encounter  Procedures  . EKG 12-Lead   No orders of the defined types were placed in this encounter.   Chief Complaint  Patient presents with  . Follow-up    CAD Cabg 1991    History of Present Illness:    Benjamin Browning is a 72 y.o. male with CAD with CABG in 1991  PCI and Taxus stent in 2005 last seen 02/20/17. Compliance with diet, lifestyle and medications: Yes  He continues to smoke 2 cigars a day and intends to continue this he is happy with the quality of his life he has no chest pain shortness of breath palpitation or syncope he took a nitroglycerin once in the last 12 months but was not having typical angina.  No edema syncope  or TIA Past Medical History:  Diagnosis Date  . CAD (coronary artery disease)   . Cerebrovascular accident ([place]) 4/01  . Diabetes mellitus 2005   type 2  . Heart attack ([place]) 2005   w/ Taxus stenting to a native vessel   . Heart attack ([place]) 1991  . Hyperlipidemia   . Positive PPD     Past Surgical History:  Procedure Laterality Date  . APPENDECTOMY     as child  . CORONARY ARTERY BYPASS GRAFT  1991  . INGUINAL HERNIA REPAIR  1985    Current Medications: Current Meds  Medication Sig  . amLODipine (NORVASC) 10 MG tablet TAKE 1 TABLET DAILY  . aspirin 325 MG EC tablet Take 650 mg by mouth daily.   Marland Kitchen atorvastatin (LIPITOR) 20 MG tablet TAKE 1 TABLET DAILY  . enalapril (VASOTEC) 20 MG tablet TAKE 1 TABLET TWICE A DAY  . glimepiride (AMARYL) 2 MG tablet TAKE 1 TABLET EVERY MORNING  . metFORMIN (GLUCOPHAGE) 1000 MG tablet TAKE 1 TABLET TWICE A DAY  . metoprolol tartrate (LOPRESSOR) 50 MG tablet TAKE 1 TABLET TWICE A DAY  . nitroGLYCERIN (NITROSTAT) 0.4 MG SL tablet Place 1 tablet (0.4 mg total) under the tongue every 5 (five) minutes as needed for chest pain.     Allergies:   Patient has no known allergies.   Social History   Socioeconomic History  . Marital status: Married  Spouse name: Not on file  . Number of children: Not on file  . Years of education: Not on file  . Highest education level: Not on file  Occupational History  . Not on file  Social Needs  . Financial resource strain: Not on file  . Food insecurity:    Worry: Not on file    Inability: Not on file  . Transportation needs:    Medical: Not on file    Non-medical: Not on file  Tobacco Use  . Smoking status: Current Some Day Smoker    Types: Cigars  . Smokeless tobacco: Never Used  . Tobacco comment: 2 cigars per day/w down to one a day   Substance and Sexual Activity  . Alcohol use: Yes    Alcohol/week: 3.0 standard drinks    Types: 3 Standard drinks or equivalent per week  . Drug use: No  .  Sexual activity: Not on file  Lifestyle  . Physical activity:    Days per week: Not on file    Minutes per session: Not on file  . Stress: Not on file  Relationships  . Social connections:    Talks on phone: Not on file    Gets together: Not on file    Attends religious service: Not on file    Active member of club or organization: Not on file    Attends meetings of clubs or organizations: Not on file    Relationship status: Not on file  Other Topics Concern  . Not on file  Social History Narrative   Separated. 3 kids. 10 grandkids.       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       Hobbies: reading, used to play a lot of golf     Family History: The patient's family history includes Hodgkin's lymphoma in his father. ROS:   Please see the history of present illness.    All other systems reviewed and are negative.  EKGs/Labs/Other Studies Reviewed:    The following studies were reviewed today:  EKG:  EKG ordered today.  The ekg ordered today demonstrates [facility] old inferior MI  Recent Labs: 01/23/2018 cholesterol 129 HDL 32 LDL 59 A1c 7.0 creatinine normal at 1.09 at that time his transaminase ALT was normal 01/23/2018: ALT 14; BUN 16; Creatinine, Ser 1.09; Hemoglobin 14.5; Platelets 327.0; Potassium 4.5; Sodium 138  Recent Lipid Panel    Component Value Date/Time   CHOL 129 01/23/2018 0914   TRIG 154.0 (H) 01/23/2018 0914   TRIG 124 12/01/2005 1115   HDL 39.20 01/23/2018 0914   CHOLHDL 3 01/23/2018 0914   VLDL 30.8 01/23/2018 0914   LDLCALC 59 01/23/2018 0914   LDLDIRECT 63.0 07/21/2017 0851    Physical Exam:    VS:  BP (!) 138/50   Pulse 82   Ht 5\' 7"  (1.702 m)   Wt 182 lb (82.6 kg)   SpO2 96%   BMI 28.51 kg/m     Wt Readings from Last 3 Encounters:  02/02/18 182 lb (82.6 kg)  01/23/18 180 lb (81.6 kg)  12/21/17 181 lb 12.8 oz (82.5 kg)     GEN:  Well nourished, well developed in no acute distress HEENT: Normal NECK: No JVD; No carotid  bruits LYMPHATICS: No lymphadenopathy CARDIAC: RRR, no murmurs, rubs, gallops RESPIRATORY:  Clear to auscultation without rales, wheezing or rhonchi  ABDOMEN: Soft, non-tender, non-distended MUSCULOSKELETAL:  No edema; No deformity  SKIN: Warm and dry NEUROLOGIC:  Alert and oriented  x 3 PSYCHIATRIC:  Normal affect    Signed, Benjamin More, MD  02/02/2018 9:26 AM    [facility]

## 2018-02-02 ENCOUNTER — Ambulatory Visit (INDEPENDENT_AMBULATORY_CARE_PROVIDER_SITE_OTHER): Payer: Medicare Other | Admitting: Cardiology

## 2018-02-02 ENCOUNTER — Encounter: Payer: Self-pay | Admitting: Cardiology

## 2018-02-02 VITALS — BP 138/50 | HR 82 | Ht 67.0 in | Wt 182.0 lb

## 2018-02-02 DIAGNOSIS — E785 Hyperlipidemia, unspecified: Secondary | ICD-10-CM | POA: Diagnosis not present

## 2018-02-02 DIAGNOSIS — I25119 Atherosclerotic heart disease of native coronary artery with unspecified angina pectoris: Secondary | ICD-10-CM | POA: Diagnosis not present

## 2018-02-02 DIAGNOSIS — I10 Essential (primary) hypertension: Secondary | ICD-10-CM | POA: Diagnosis not present

## 2018-02-02 DIAGNOSIS — E119 Type 2 diabetes mellitus without complications: Secondary | ICD-10-CM | POA: Diagnosis not present

## 2018-02-02 NOTE — Patient Instructions (Signed)
Medication Instructions: REDUCE ASPIRIN TO 81 MG DAILY   If you need a refill on your cardiac medications before your next appointment, please call your pharmacy.   Lab work:None   If you have labs (blood work) drawn today and your tests are completely normal, you will receive your results only by: Marland Kitchen MyChart Message (if you have MyChart) OR . A paper copy in the mail If you have any lab test that is abnormal or we need to change your treatment, we will call you to review the results.  Testing/Procedures: None  Follow-Up: At [facility], you and your health needs are our priority.  As part of our continuing mission to provide you with exceptional heart care, we have created designated Provider Care Teams.  These Care Teams include your primary Cardiologist (physician) and Advanced Practice Providers (APPs -  Physician Assistants and Nurse Practitioners) who all work together to provide you with the care you need, when you need it. . You will need a follow up appointment in 12 months.  Please call our office 2 months in advance to schedule this appointment.    Any Other Special Instructions Will Be Listed Below (If Applicable).

## 2018-02-18 ENCOUNTER — Other Ambulatory Visit: Payer: Self-pay | Admitting: Family Medicine

## 2018-03-14 ENCOUNTER — Other Ambulatory Visit: Payer: Self-pay | Admitting: Family Medicine

## 2018-03-20 ENCOUNTER — Ambulatory Visit (INDEPENDENT_AMBULATORY_CARE_PROVIDER_SITE_OTHER): Payer: Medicare Other | Admitting: Family Medicine

## 2018-03-20 ENCOUNTER — Encounter: Payer: Self-pay | Admitting: Family Medicine

## 2018-03-20 VITALS — BP 110/62 | HR 69 | Temp 97.9°F | Ht 67.0 in | Wt 165.2 lb

## 2018-03-20 DIAGNOSIS — E119 Type 2 diabetes mellitus without complications: Secondary | ICD-10-CM

## 2018-03-20 DIAGNOSIS — R634 Abnormal weight loss: Secondary | ICD-10-CM | POA: Diagnosis not present

## 2018-03-20 DIAGNOSIS — R1310 Dysphagia, unspecified: Secondary | ICD-10-CM

## 2018-03-20 DIAGNOSIS — I10 Essential (primary) hypertension: Secondary | ICD-10-CM | POA: Diagnosis not present

## 2018-03-20 MED ORDER — OMEPRAZOLE 40 MG PO CPDR: 40.0000 mg | DELAYED_RELEASE_CAPSULE | Freq: Every day | ORAL | 3 refills | Status: AC

## 2018-03-20 NOTE — Progress Notes (Signed)
Phone [phone]   Subjective:  Benjamin Browning is a 73 y.o. year old very pleasant male patient who presents for/with See problem oriented charting ROS-reports trouble swallowing and unintentional weight loss.  Denies fever or chills.  States he feels well overall  Past Medical History-  Patient Active Problem List   Diagnosis Date Noted  . DNR (do not resuscitate) 12/31/2013    Priority: High  . TOBACCO USE 03/17/2008    Priority: High  . Diabetes mellitus type II, controlled ([place]) 08/21/2006    Priority: High  . Coronary artery disease involving native coronary artery of native heart with angina pectoris ([place]) 08/21/2006    Priority: High  . Hyperlipidemia 12/31/2013    Priority: Medium  . Essential hypertension 01/22/2007    Priority: Medium  . History of bladder cancer 08/21/2006    Priority: Medium  . History of CVA (cerebrovascular accident) 08/21/2006    Priority: Medium  . Bilateral primary osteoarthritis of hip 06/29/2016  . Bilateral knee pain 07/26/2012    Medications- reviewed and updated Current Outpatient Medications  Medication Sig Dispense Refill  . amLODipine (NORVASC) 10 MG tablet TAKE 1 TABLET DAILY 90 tablet 3  . aspirin 81 MG tablet Take 81 mg by mouth 2 (two) times daily.     Marland Kitchen atorvastatin (LIPITOR) 20 MG tablet TAKE 1 TABLET DAILY 90 tablet 3  . enalapril (VASOTEC) 20 MG tablet TAKE 1 TABLET TWICE A DAY. MAKE AN APPOINTMENT FOR    FURTHER REFILLS 60 tablet 0  . glimepiride (AMARYL) 2 MG tablet TAKE 1 TABLET EVERY MORNING 30 tablet 0  . metFORMIN (GLUCOPHAGE) 1000 MG tablet TAKE 1 TABLET TWICE A DAY 180 tablet 3  . metoprolol tartrate (LOPRESSOR) 50 MG tablet TAKE 1 TABLET TWICE A DAY 180 tablet 3  . nitroGLYCERIN (NITROSTAT) 0.4 MG SL tablet Place 1 tablet (0.4 mg total) under the tongue every 5 (five) minutes as needed for chest pain. 25 tablet 11     Objective:  BP 110/62 (BP Location: Right Arm, Patient Position: Sitting, Cuff Size: Large)    Pulse 69   Temp 97.9 F (36.6 C) (Oral)   Ht 5\' 7"  (1.702 m)   Wt 165 lb 3.2 oz (74.9 kg)   SpO2 95%   BMI 25.87 kg/m  Gen: NAD, resting comfortably Oropharynx largely normal, nasal turbinates largely normal CV: RRR no murmurs rubs or gallops Lungs: CTAB no crackles, wheeze, rhonchi Abdomen: soft/nontender/nondistended/normal bowel sounds. No rebound or guarding.  Ext: no edema Skin: warm, dry    Assessment and Plan   Dysphagia, unspecified type - Plan: Ambulatory referral to Gastroenterology Unintentional weight loss - Plan: Ambulatory referral to Gastroenterology  S: every once and a while gets into cycle of throwing up phlegm- has to wait for it to clear or he simply cant swallow anything. Feels like throat intermittently closed up and like he cannot swallow well. Feels fine otherwise. Denies lip or tongue swelling. No tongue rawness. No shortness of breath. No burning in the chest.   At these times doesn't even feel like he can get liquids down. Has to carry a bowl to be able to spit stuff up/throw stuff up. Not a large volume individually.   During episodes-feels swollen in throat.Outside of these episodes can swallow normally.   Down 15 lbs since about 6 weeks ago. States appetite is very low and simply doesn't feel hungry.   Coffee made it worse-has been slightly better off coffee A/P: 73 year old male with dysphagia  and unintentional weight loss of 15 pounds over 6 weeks.  He is a long-term smoker and with weight loss I am concerned about malignancy risk.  Will refer to GI for further evaluation - Has improved coming off of coffee and this makes me wonder about a reflux element so we will start omeprazole 40 mg to see if this makes a difference - Given sensation of throat closing and he is on an ACE inhibitor we will stop that immediately.  We will get updated urine microalbumin to creatinine ratio in regards to his diabetes - Since this increases his risk of hypertension we  will follow-up with in 2 weeks for blood pressure recheck  Future Appointments  Date Time Provider [place]  04/03/2018  9:20 AM Marin Olp, MD [facility]  07/26/2018  9:20 AM Marin Olp, MD [facility]   Return in about 2 weeks (around 04/03/2018) for follow up- or sooner if needed.  Lab/Order associations: Dysphagia, unspecified type - Plan: Ambulatory referral to Gastroenterology  Unintentional weight loss - Plan: Ambulatory referral to Gastroenterology  Controlled type 2 diabetes mellitus without complication, without long-term current use of insulin ([place]) - Plan: Microalbumin / creatinine urine ratio  Essential hypertension  Meds ordered this encounter  Medications  . omeprazole (PRILOSEC) 40 MG capsule    Sig: Take 1 capsule (40 mg total) by mouth daily.    Dispense:  30 capsule    Refill:  3  New symptom of unknown etiology-potentially high risk etiology  Return precautions advised.  Garret Reddish, MD

## 2018-03-20 NOTE — Patient Instructions (Addendum)
Trial omeprazole 40 mg before breakfast each day  Stop enalapril for now   We will call you within two weeks about your referral to gastroenterology due to weight loss and trouble swallowing. If you do not hear within 2 weeks, give [place] a call.    Please let us know if any worsening symptoms-seek care immediately if you have trouble breathing   Please stop by lab before you go If you do not have mychart- we will call you about results within 5 business days of [place] receiving them.  If you have mychart- we will send your results within 3 business days of [place] receiving them.  If abnormal or we want to clarify a result, we will call or mychart you to make sure you receive the message.  If you have questions or concerns or don't hear within 5-7 days, please send [place] a message or call us.

## 2018-03-21 NOTE — Addendum Note (Signed)
Addended by: Francis Dowse T on: 03/21/2018 09:14 AM   Modules accepted: Orders

## 2018-03-28 ENCOUNTER — Ambulatory Visit: Payer: Medicare Other | Admitting: Physician Assistant

## 2018-03-30 ENCOUNTER — Encounter: Payer: Self-pay | Admitting: Physician Assistant

## 2018-04-03 ENCOUNTER — Ambulatory Visit (INDEPENDENT_AMBULATORY_CARE_PROVIDER_SITE_OTHER): Payer: Medicare Other | Admitting: Family Medicine

## 2018-04-03 ENCOUNTER — Encounter: Payer: Self-pay | Admitting: Family Medicine

## 2018-04-03 VITALS — BP 110/70 | HR 63 | Temp 98.1°F | Ht 67.0 in | Wt 162.6 lb

## 2018-04-03 DIAGNOSIS — R1310 Dysphagia, unspecified: Secondary | ICD-10-CM | POA: Diagnosis not present

## 2018-04-03 DIAGNOSIS — R634 Abnormal weight loss: Secondary | ICD-10-CM

## 2018-04-03 DIAGNOSIS — I10 Essential (primary) hypertension: Secondary | ICD-10-CM | POA: Diagnosis not present

## 2018-04-03 NOTE — Progress Notes (Signed)
Phone [phone]   Subjective:  Benjamin Browning is a 73 y.o. year old very pleasant male patient who presents for/with See problem oriented charting ROS-still with trouble swallowing but improving.  No chest pain or shortness of breath reported.  Unintentional weight loss noted  Past Medical History-  Patient Active Problem List   Diagnosis Date Noted  . DNR (do not resuscitate) 12/31/2013    Priority: High  . TOBACCO USE 03/17/2008    Priority: High  . Diabetes mellitus type II, controlled ([place]) 08/21/2006    Priority: High  . Coronary artery disease involving native coronary artery of native heart with angina pectoris ([place]) 08/21/2006    Priority: High  . Hyperlipidemia 12/31/2013    Priority: Medium  . Essential hypertension 01/22/2007    Priority: Medium  . History of bladder cancer 08/21/2006    Priority: Medium  . History of CVA (cerebrovascular accident) 08/21/2006    Priority: Medium  . Bilateral primary osteoarthritis of hip 06/29/2016  . Bilateral knee pain 07/26/2012    Medications- reviewed and updated Current Outpatient Medications  Medication Sig Dispense Refill  . amLODipine (NORVASC) 10 MG tablet TAKE 1 TABLET DAILY 90 tablet 3  . aspirin 81 MG tablet Take 81 mg by mouth 2 (two) times daily.     Marland Kitchen atorvastatin (LIPITOR) 20 MG tablet TAKE 1 TABLET DAILY 90 tablet 3  . glimepiride (AMARYL) 2 MG tablet TAKE 1 TABLET EVERY MORNING 30 tablet 0  . metFORMIN (GLUCOPHAGE) 1000 MG tablet TAKE 1 TABLET TWICE A DAY 180 tablet 3  . metoprolol tartrate (LOPRESSOR) 50 MG tablet TAKE 1 TABLET TWICE A DAY 180 tablet 3  . omeprazole (PRILOSEC) 40 MG capsule Take 1 capsule (40 mg total) by mouth daily. 30 capsule 3  . nitroGLYCERIN (NITROSTAT) 0.4 MG SL tablet Place 1 tablet (0.4 mg total) under the tongue every 5 (five) minutes as needed for chest pain. 25 tablet 11     Objective:  BP 110/70   Pulse 63   Temp 98.1 F (36.7 C) (Oral)   Ht 5\' 7"  (1.702 m)   Wt 162 lb  9.6 oz (73.8 kg)   SpO2 98%   BMI 25.47 kg/m  Gen: NAD, resting comfortably No edema of the tongue or pharynx or lips CV: RRR no murmurs rubs or gallops Lungs: CTAB no crackles, wheeze, rhonchi Abdomen: soft/nontender/nondistended/normal bowel sounds.  Ext: no edema Skin: warm, dry    Assessment and Plan   Weight loss/Dysphagia follow-up S: Patient presented in late January with trouble swallowing, unintentional weight loss.  He was getting in the cycles up to an hour of feeling like his throat was full, trouble swallowing, throwing up phlegm as he felt like he could not swallow anything including water.  Outside of these episodes-he felt fine otherwise.  Between episodes he felt like he could swallow normally.  He did note that coffee made his symptoms worse-have gotten slightly better off coffee.  We placed him on omeprazole 40 mg, stopped his enalapril in case of angioedema, referred to GI.  He was down 15 pounds in 6 weeks at last visit.  He has lost an additional 3 pounds.  Fortunately blood pressure is controlled without enalapril.He states episodes are less severe- able to swallow water with episodes. Eating a lot of soft foods. Episode length has not improved.   A/P: Symptoms of dysphagia have improved but weight loss persists- patient states he does not pick up his phone unless he knows the  number-for that reason we are going to go ahead and get him scheduled with GI before he leaves today- really want their opinion on endoscopy especially with him being longtime smoker  For diabetes Brought urine for microalbumin/creatinine ratio today.   Essential hypertension S: controlled on  metoprolol 50mg  BID, amlodipine 10mg .  Holding Enalapril 20mg  daily-doubt cause of trouble swallowing but we have held enalapril-with swollen throat feeling we have opted to remain off BP Readings from Last 3 Encounters:  04/03/18 110/70  03/20/18 110/62  02/02/18 (!) 138/50  A/P: We discussed blood  pressure goal of <140/90. Continue current meds: Remain off enalapril for now     Future Appointments  Date Time Provider [place]  07/26/2018  9:20 AM Marin Olp, MD [facility]   Lab/Order associations: Dysphagia, unspecified type  Unintentional weight loss  Essential hypertension  Return precautions advised.  Garret Reddish, MD

## 2018-04-03 NOTE — Assessment & Plan Note (Signed)
S: controlled on  metoprolol 50mg  BID, amlodipine 10mg .  Holding Enalapril 20mg  daily-doubt cause of trouble swallowing but we have held enalapril-with swollen throat feeling we have opted to remain off BP Readings from Last 3 Encounters:  04/03/18 110/70  03/20/18 110/62  02/02/18 (!) 138/50  A/P: We discussed blood pressure goal of <140/90. Continue current meds: Remain off enalapril for now

## 2018-04-03 NOTE — Patient Instructions (Addendum)
Health Maintenance Due  Topic Date Due  . TETANUS/TDAP Pt will go to the pharmacy to have this done 12/23/2015   We will get you scheduled for a GI/stomach doctor follow-up before you leave  I am glad you are improving some-continue to stay away from coffee and stick with the omeprazole 40 mg once a day for now  If you have new or worsening symptoms please let us know

## 2018-04-13 ENCOUNTER — Other Ambulatory Visit: Payer: Self-pay | Admitting: Family Medicine

## 2018-04-13 NOTE — Telephone Encounter (Signed)
Rx request 

## 2018-04-14 DIAGNOSIS — I2510 Atherosclerotic heart disease of native coronary artery without angina pectoris: Secondary | ICD-10-CM | POA: Diagnosis present

## 2018-04-14 DIAGNOSIS — Z794 Long term (current) use of insulin: Secondary | ICD-10-CM | POA: Diagnosis not present

## 2018-04-14 DIAGNOSIS — F17211 Nicotine dependence, cigarettes, in remission: Secondary | ICD-10-CM | POA: Diagnosis not present

## 2018-04-14 DIAGNOSIS — I517 Cardiomegaly: Secondary | ICD-10-CM | POA: Diagnosis not present

## 2018-04-14 DIAGNOSIS — Z7984 Long term (current) use of oral hypoglycemic drugs: Secondary | ICD-10-CM | POA: Diagnosis not present

## 2018-04-14 DIAGNOSIS — Z8551 Personal history of malignant neoplasm of bladder: Secondary | ICD-10-CM | POA: Diagnosis not present

## 2018-04-14 DIAGNOSIS — I4510 Unspecified right bundle-branch block: Secondary | ICD-10-CM | POA: Diagnosis not present

## 2018-04-14 DIAGNOSIS — C779 Secondary and unspecified malignant neoplasm of lymph node, unspecified: Secondary | ICD-10-CM | POA: Diagnosis not present

## 2018-04-14 DIAGNOSIS — Z9861 Coronary angioplasty status: Secondary | ICD-10-CM | POA: Diagnosis not present

## 2018-04-14 DIAGNOSIS — C383 Malignant neoplasm of mediastinum, part unspecified: Secondary | ICD-10-CM | POA: Diagnosis not present

## 2018-04-14 DIAGNOSIS — K219 Gastro-esophageal reflux disease without esophagitis: Secondary | ICD-10-CM | POA: Diagnosis not present

## 2018-04-14 DIAGNOSIS — Z7982 Long term (current) use of aspirin: Secondary | ICD-10-CM | POA: Diagnosis not present

## 2018-04-14 DIAGNOSIS — C771 Secondary and unspecified malignant neoplasm of intrathoracic lymph nodes: Secondary | ICD-10-CM | POA: Diagnosis not present

## 2018-04-14 DIAGNOSIS — Z743 Need for continuous supervision: Secondary | ICD-10-CM | POA: Diagnosis not present

## 2018-04-14 DIAGNOSIS — Z8673 Personal history of transient ischemic attack (TIA), and cerebral infarction without residual deficits: Secondary | ICD-10-CM | POA: Diagnosis not present

## 2018-04-14 DIAGNOSIS — R0689 Other abnormalities of breathing: Secondary | ICD-10-CM | POA: Diagnosis not present

## 2018-04-14 DIAGNOSIS — E785 Hyperlipidemia, unspecified: Secondary | ICD-10-CM | POA: Diagnosis present

## 2018-04-14 DIAGNOSIS — R000 Tachycardia, unspecified: Secondary | ICD-10-CM | POA: Diagnosis not present

## 2018-04-14 DIAGNOSIS — R0902 Hypoxemia: Secondary | ICD-10-CM | POA: Diagnosis not present

## 2018-04-14 DIAGNOSIS — Z6823 Body mass index (BMI) 23.0-23.9, adult: Secondary | ICD-10-CM | POA: Diagnosis not present

## 2018-04-14 DIAGNOSIS — Z87891 Personal history of nicotine dependence: Secondary | ICD-10-CM | POA: Diagnosis not present

## 2018-04-14 DIAGNOSIS — E119 Type 2 diabetes mellitus without complications: Secondary | ICD-10-CM | POA: Diagnosis present

## 2018-04-14 DIAGNOSIS — K208 Other esophagitis: Secondary | ICD-10-CM | POA: Diagnosis not present

## 2018-04-14 DIAGNOSIS — Z95810 Presence of automatic (implantable) cardiac defibrillator: Secondary | ICD-10-CM | POA: Diagnosis not present

## 2018-04-14 DIAGNOSIS — K229 Disease of esophagus, unspecified: Secondary | ICD-10-CM | POA: Diagnosis not present

## 2018-04-14 DIAGNOSIS — R072 Precordial pain: Secondary | ICD-10-CM | POA: Diagnosis not present

## 2018-04-14 DIAGNOSIS — R7989 Other specified abnormal findings of blood chemistry: Secondary | ICD-10-CM | POA: Diagnosis present

## 2018-04-14 DIAGNOSIS — E43 Unspecified severe protein-calorie malnutrition: Secondary | ICD-10-CM | POA: Diagnosis present

## 2018-04-14 DIAGNOSIS — I6782 Cerebral ischemia: Secondary | ICD-10-CM | POA: Diagnosis not present

## 2018-04-14 DIAGNOSIS — I25810 Atherosclerosis of coronary artery bypass graft(s) without angina pectoris: Secondary | ICD-10-CM | POA: Diagnosis not present

## 2018-04-14 DIAGNOSIS — J9859 Other diseases of mediastinum, not elsewhere classified: Secondary | ICD-10-CM | POA: Diagnosis not present

## 2018-04-14 DIAGNOSIS — R0789 Other chest pain: Secondary | ICD-10-CM | POA: Diagnosis not present

## 2018-04-14 DIAGNOSIS — Z955 Presence of coronary angioplasty implant and graft: Secondary | ICD-10-CM | POA: Diagnosis not present

## 2018-04-14 DIAGNOSIS — R918 Other nonspecific abnormal finding of lung field: Secondary | ICD-10-CM | POA: Diagnosis not present

## 2018-04-14 DIAGNOSIS — R911 Solitary pulmonary nodule: Secondary | ICD-10-CM | POA: Diagnosis not present

## 2018-04-14 DIAGNOSIS — I10 Essential (primary) hypertension: Secondary | ICD-10-CM | POA: Diagnosis present

## 2018-04-14 DIAGNOSIS — Z720 Tobacco use: Secondary | ICD-10-CM | POA: Diagnosis not present

## 2018-04-14 DIAGNOSIS — R9431 Abnormal electrocardiogram [ECG] [EKG]: Secondary | ICD-10-CM | POA: Diagnosis not present

## 2018-04-14 DIAGNOSIS — Z66 Do not resuscitate: Secondary | ICD-10-CM | POA: Diagnosis present

## 2018-04-14 DIAGNOSIS — R634 Abnormal weight loss: Secondary | ICD-10-CM | POA: Diagnosis not present

## 2018-04-14 DIAGNOSIS — T18198A Other foreign object in esophagus causing other injury, initial encounter: Secondary | ICD-10-CM | POA: Diagnosis not present

## 2018-04-14 DIAGNOSIS — R896 Abnormal cytological findings in specimens from other organs, systems and tissues: Secondary | ICD-10-CM | POA: Diagnosis not present

## 2018-04-14 DIAGNOSIS — R279 Unspecified lack of coordination: Secondary | ICD-10-CM | POA: Diagnosis not present

## 2018-04-14 DIAGNOSIS — R1310 Dysphagia, unspecified: Secondary | ICD-10-CM | POA: Diagnosis present

## 2018-04-14 DIAGNOSIS — I499 Cardiac arrhythmia, unspecified: Secondary | ICD-10-CM | POA: Diagnosis not present

## 2018-04-14 DIAGNOSIS — Z951 Presence of aortocoronary bypass graft: Secondary | ICD-10-CM | POA: Diagnosis not present

## 2018-04-14 DIAGNOSIS — E1165 Type 2 diabetes mellitus with hyperglycemia: Secondary | ICD-10-CM | POA: Diagnosis not present

## 2018-04-14 DIAGNOSIS — R079 Chest pain, unspecified: Secondary | ICD-10-CM | POA: Diagnosis not present

## 2018-04-14 DIAGNOSIS — C3490 Malignant neoplasm of unspecified part of unspecified bronchus or lung: Secondary | ICD-10-CM | POA: Diagnosis present

## 2018-04-14 DIAGNOSIS — K222 Esophageal obstruction: Secondary | ICD-10-CM | POA: Diagnosis present

## 2018-04-14 DIAGNOSIS — I493 Ventricular premature depolarization: Secondary | ICD-10-CM | POA: Diagnosis not present

## 2018-04-14 DIAGNOSIS — R5383 Other fatigue: Secondary | ICD-10-CM | POA: Diagnosis not present

## 2018-04-14 DIAGNOSIS — R9389 Abnormal findings on diagnostic imaging of other specified body structures: Secondary | ICD-10-CM | POA: Diagnosis not present

## 2018-04-27 ENCOUNTER — Ambulatory Visit: Payer: Medicare Other | Admitting: Internal Medicine

## 2018-04-30 ENCOUNTER — Other Ambulatory Visit: Payer: Self-pay | Admitting: Family Medicine

## 2018-05-05 ENCOUNTER — Other Ambulatory Visit: Payer: Self-pay | Admitting: Family Medicine

## 2018-05-07 ENCOUNTER — Telehealth: Payer: Self-pay | Admitting: Family Medicine

## 2018-05-07 NOTE — Telephone Encounter (Signed)
Routed to wrong practice. Sending to [facility].

## 2018-05-07 NOTE — Telephone Encounter (Signed)
Copied from [place] [phone]. Topic: Quick Communication - See Telephone Encounter >> May 07, 2018  4:08 PM Rutherford Nail, NT wrote: CRM for notification. See Telephone encounter for: 05/07/18. Patient's wife, [name], calling and states that she would like a call back from Dr Yong Channel. Would not go into detail about what the call is regarding. States that "it is personal." Please advise.  CB#: [phone]

## 2018-05-07 NOTE — Telephone Encounter (Signed)
See note

## 2018-05-08 NOTE — Telephone Encounter (Signed)
Please advise 

## 2018-05-08 NOTE — Telephone Encounter (Signed)
Need more info- she will have to understand we are very busy with current preparations for covid 19- I need info to be able to communicate back through staff potentially will determine if office visit is needed

## 2018-05-09 NOTE — Telephone Encounter (Signed)
Left message for Benjamin Browning to return phone call.

## 2018-05-11 NOTE — Telephone Encounter (Signed)
Left detailed message for [name] to return phone call.

## 2018-05-23 DEATH — deceased

## 2018-07-26 ENCOUNTER — Ambulatory Visit: Payer: Medicare Other | Admitting: Family Medicine

## 2018-08-08 ENCOUNTER — Telehealth: Payer: Self-pay | Admitting: Family Medicine

## 2018-08-08 NOTE — Telephone Encounter (Signed)
Spoke with wife. Had advanced lung cancer. Apparently this was causing his dysphagia-we had ordered GI referral for dysphagia but apparently this was 4 weeks out from our last visit and he ended up dying within a few weeks of our visit-very sad situation and I offered wife my condolences.

## 2018-08-08 NOTE — Telephone Encounter (Signed)
[place] [facility] [place] RECORD [facility]urse Patient Name: Benjamin Browning Gender: Male DOB: 09/09/1946 Age: 73 Y 10 M 28 D Return Phone Number: [phone] (Primary) Address: City/State/Zip: [place] [address] Client [place] [facility] [place] Client Site [place] [facility] [place] Night Physician Garret Reddish- MD Contact Type Call Who Is Calling Patient / Member / Family / Caregiver Call Type Triage / Clinical Caller Name Benjamin Christmas Bernardi Relationship To Patient Spouse Return Phone Number [phone] (Primary) Chief Complaint Paging or Request for Consult Reason for Call Symptomatic / Request for [place] states husband recently died. Would like to speak to his doctor about how it happened so suddenly. Translation No Nurse Assessment Nurse: Maren Reamer, RN, [place] Date/Time (Eastern Time): 08/07/2018 9:23:44 PM Confirm and document reason for call. If symptomatic, describe symptoms. ---Caller states husband recently died. Would like to speak to his doctor about how it happened so suddenly. Caller indicates she did not want to talk with me. Caller states that her spouse was in the office twice the last month of his life and nothing was done for him then he had to call 911 and was dead a week later. Caller states "I want to know why, what the hell happened" Caller did not want to speak to me or the on call physician she indicates she wants to speak to Dr. Yong Channel or his secretary/assistant. Has the patient had close contact with a person known or suspected to have the novel coronavirus illness OR traveled / lives in area with major community spread (including international travel) in the last 14 days from the onset of symptoms? * If Asymptomatic, screen for exposure and travel within the last 14 days. ---Not Applicable Does the patient have any new or worsening symptoms?  ---No Please document clinical information provided and list any resource used. ---I informed caller that MD office would get a copy of this transcript and suggested she call during normal business hours and request a call from Dr. Yong Channel. Guidelines Guideline Title Affirmed Question Affirmed Notes Nurse Date/Time (Eastern Time) Disp. Time Eilene Ghazi Time) Disposition Final User 08/07/2018 9:28:17 PM Clinical Call Yes Maren Reamer, RN, Ricka Burdock

## 2018-08-08 NOTE — Telephone Encounter (Signed)
Forwarding to Dr. Yong Channel.
